# Patient Record
Sex: Male | Born: 1965 | Race: White | Hispanic: No | Marital: Married | Smoking: Former smoker
Health system: Southern US, Community
[De-identification: ages and names within clinical notes are randomized; demographics above are authoritative.]

---

## 1999-07-12 ENCOUNTER — Emergency Department (HOSPITAL_COMMUNITY): Admission: EM | Admit: 1999-07-12 | Discharge: 1999-07-12 | Payer: Self-pay | Admitting: Emergency Medicine

## 2004-04-21 ENCOUNTER — Emergency Department (HOSPITAL_COMMUNITY): Admission: EM | Admit: 2004-04-21 | Discharge: 2004-04-21 | Payer: Self-pay | Admitting: Emergency Medicine

## 2005-09-11 ENCOUNTER — Encounter (HOSPITAL_COMMUNITY): Admission: RE | Admit: 2005-09-11 | Discharge: 2005-10-11 | Payer: Self-pay | Admitting: Family Medicine

## 2005-09-14 ENCOUNTER — Inpatient Hospital Stay (HOSPITAL_COMMUNITY): Admission: EM | Admit: 2005-09-14 | Discharge: 2005-09-15 | Payer: Self-pay | Admitting: Emergency Medicine

## 2005-11-03 ENCOUNTER — Ambulatory Visit (HOSPITAL_COMMUNITY): Admission: RE | Admit: 2005-11-03 | Discharge: 2005-11-03 | Payer: Self-pay | Admitting: Emergency Medicine

## 2005-11-03 ENCOUNTER — Emergency Department (HOSPITAL_COMMUNITY): Admission: EM | Admit: 2005-11-03 | Discharge: 2005-11-03 | Payer: Self-pay | Admitting: Emergency Medicine

## 2005-12-19 ENCOUNTER — Encounter (HOSPITAL_COMMUNITY): Admission: RE | Admit: 2005-12-19 | Discharge: 2006-01-18 | Payer: Self-pay | Admitting: Specialist

## 2006-09-11 ENCOUNTER — Encounter (HOSPITAL_COMMUNITY): Admission: RE | Admit: 2006-09-11 | Discharge: 2006-10-11 | Payer: Self-pay | Admitting: Family Medicine

## 2006-09-17 ENCOUNTER — Encounter: Admission: RE | Admit: 2006-09-17 | Discharge: 2006-09-17 | Payer: Self-pay | Admitting: Family Medicine

## 2006-10-14 ENCOUNTER — Encounter (HOSPITAL_COMMUNITY): Admission: RE | Admit: 2006-10-14 | Discharge: 2006-11-13 | Payer: Self-pay | Admitting: Family Medicine

## 2007-10-18 ENCOUNTER — Emergency Department (HOSPITAL_COMMUNITY): Admission: EM | Admit: 2007-10-18 | Discharge: 2007-10-18 | Payer: Self-pay | Admitting: Emergency Medicine

## 2007-10-23 ENCOUNTER — Ambulatory Visit (HOSPITAL_COMMUNITY): Admission: RE | Admit: 2007-10-23 | Discharge: 2007-10-23 | Payer: Self-pay | Admitting: Family Medicine

## 2008-01-21 ENCOUNTER — Encounter (INDEPENDENT_AMBULATORY_CARE_PROVIDER_SITE_OTHER): Payer: Self-pay | Admitting: Surgery

## 2008-01-21 ENCOUNTER — Ambulatory Visit (HOSPITAL_COMMUNITY): Admission: RE | Admit: 2008-01-21 | Discharge: 2008-01-21 | Payer: Self-pay | Admitting: Surgery

## 2008-10-16 HISTORY — PX: CHOLECYSTECTOMY: SHX55

## 2009-07-27 ENCOUNTER — Emergency Department (HOSPITAL_COMMUNITY): Admission: EM | Admit: 2009-07-27 | Discharge: 2009-07-27 | Payer: Self-pay | Admitting: Emergency Medicine

## 2010-03-26 ENCOUNTER — Encounter: Payer: Self-pay | Admitting: Family Medicine

## 2010-07-17 NOTE — Op Note (Signed)
NAME:  Eric Mcdonald, Eric Mcdonald NO.:  [id number]   MEDICAL RECORD NO.:  [id number]          PATIENT TYPE:  AMB   LOCATION:  DAY                          FACILITY:  [facility]   PHYSICIAN:  Sandria Bales. Ezzard Standing, M.D.  DATE OF BIRTH:  Oct 29, 1965   DATE OF PROCEDURE:  01/21/2008  DATE OF DISCHARGE:                               OPERATIVE REPORT   Date of surgery ?   PREOPERATIVE DIAGNOSIS:  Chronic cholecystitis with cholelithiasis.   POSTOPERATIVE DIAGNOSIS:  Chronic cholecystitis with cholelithiasis.  (He has a gallstone size is 2.5 x 1.5 cm.).   SURGEON:  Sandria Bales. Ezzard Standing, M.D.   FIRST ASSISTANT:  None.   ANESTHESIA:  General endotracheal.   ESTIMATED BLOOD LOSS:  Minimal.   INDICATIONS FOR PROCEDURE:  Mr. Colee is a 45 year old white male who  has a patient of Maureen Golden/Dr. Fuller Mandril at [facility] who has had some bloating and abdominal pain and ultrasound  performed August 21 showed a large gallstone.   The patient now comes for attempted laparoscopic cholecystectomy.   The indication, potential complications of gallbladder surgery were  explained to the patient, potential complications include, but not  limited to, bleeding, infection, common bile duct injury, and  possibility of open surgery.   OPERATIVE NOTE:  The patient placed in supine position, given a general  endotracheal anesthetic.  He was given 1 gram of Ancef at initiation of  the procedure.  His abdomen was prepped with Techni-Care and sterilely  draped.  An infraumbilical incision was made.  Sharp dissection was  carried down to the abdominal cavity.  A 10 mm 0 degree laparoscope was  inserted through a 12 mm Hasson trocar.  Trocar secured with 0 Vicryl  suture.  I placed a 11-mm trocar in the subxiphoid location, a 5 mm in  the right mid subcostal and 5 mm right lateral subcostal locations.  The  gallbladder itself had some adhesions on the anterior abdominal wall  which I  took down using cautery and sharp dissection.  I got to the  gallbladder cystic duct junction and dissected out the cystic duct.  I  put clips on the cystic artery and a couple on the gallbladder side of  the cystic duct and shot intraoperative cholangiogram.   The intraoperative cholangiogram was shot using cutoff taut catheter  inserted through a 14 gauge Jelco.  I placed the taut catheter into the  side of the cut cystic duct.  I used about 8 mL of half strength Hypaque  solution and fluoroscopy showed free flow of contrast down the cystic  duct and into the common bile duct into the duodenum and up the hepatic  radicals.  This was felt to be a normal intraoperative cholangiogram.   I then removed the cystic duct catheter.  I placed three clips across  the cystic duct and divided the cystic duct and put clips across the  cystic artery and divided that.  I used primarily the hook Bovie to  divide the gallbladder from the gallbladder bed.  Prior to complete  division of the  gallbladder from the gallbladder bed, I evaluated the  gallbladder bed.  There was no bleeding and no bile leak.   I then took the gallbladder, placed it in an EndoCatch bag and delivered  it through the umbilicus.  Of note he had at least one very large stone  1.5 x 2.5 cm and I have given the specimen to the patient's family.  I  then irrigated the abdomen with a liter of saline.  I closed umbilical  port with a 0 Vicryl suture.  I closed the skin with a 5-0 Vicryl  suture, painted the wound with tincture of benzoin and steri-stripped  it.   The patient tolerated the procedure well, was transported to recovery  room in good condition.  Sponge and needle count were correct at the end  of the case.      Sandria Bales. Ezzard Standing, M.D.  Electronically Signed     DHN/MEDQ  D:  01/21/2008  T:  01/22/2008  Job:  [id number]   cc:   Marjory Lies, M.D.  Fax: [phone]

## 2010-07-20 NOTE — Consult Note (Signed)
NAME:  Eric Mcdonald, Eric Mcdonald NO.:  [id number]   MEDICAL RECORD NO.:  [id number]          PATIENT TYPE:  INP   LOCATION:  5504                         FACILITY:  [facility]   PHYSICIAN:  Georges Lynch. Gioffre, M.D.DATE OF BIRTH:  21-Apr-1965   DATE OF CONSULTATION:  09/14/2005  DATE OF DISCHARGE:                                   CONSULTATION   I was called to the emergency room this afternoon on September 14, 2005, to  evaluate this patient because of severe pain in his lower back and pain into  his right buttock.  He is a patient of Dr. Edythe Clarity.   Apparently he went to the office to see my partner, Dr. Shelle Iron, on Friday.  He had an unpaid bill, and he said he paid his bill then and he was given  some medications for pain and given an appointment to see Dr. Shelle Iron on  Monday.  He states that while at home yesterday and today he had difficulty  getting about.  In fact, he had to kneel to brush his teeth.  He was  extremely uncomfortable and that is why he came to the emergency room by  ambulance.   Basically, he has not lost any bowel or bladder control.  He just has severe  pain and spasms in his back.   ALLERGIES:  None.   MEDICATIONS:  None except the pain medication and muscle relaxants given to  him by Dr. Shelle Iron in the office.   HEALTH:  He has been in good health.   SURGERY:  Denied.   PHYSICAL EXAMINATION:  VITAL SIGNS:  In the emergency room, his blood  pressure was 111/69, temperature 98, pulse 51, respirations 20.  GENERAL:  He is alert and oriented as far as his mental status.  NECK:  No problems with his neck.  CHEST:  Clavicles negative.  Shoulders and upper extremities negative.  ABDOMEN:  Negative.  LUNGS:  Clear.  HEART:  Normal sinus rhythm with no murmur.  HIPS:  I could take his hips through range of motion without any difficulty.  He was lying supine with 2 pillows up under his knees because of his  discomfort in his back and right buttocks.  EXTREMITIES:  His sensory exam of his lower extremities was intact.  His  motor was exam was intact as well as I could examine, but he was a little  uncomfortable in his lower back.  Circulation was intact.  His calves were  fine.  No phlebitis.   X-rays of his back will be ordered when he is more comfortable.  He actually  is in spasms in his back and he really cannot get on and off an x-ray table.   PLAN:  I will bring him to the hospital to rule out a herniated disk.  We  will get an MRI of his back while he is in the hospital and also plain x-  rays of his back as well.           ______________________________  Georges Lynch. Darrelyn Hillock, M.D.     RAG/MEDQ  D:  09/14/2005  T:  09/15/2005  Job:  [id number]   cc:   Teena Irani. Arlyce Dice, M.D.  Fax: [phone]   Jene Every, M.D.  Fax: [phone]

## 2010-11-10 ENCOUNTER — Inpatient Hospital Stay (INDEPENDENT_AMBULATORY_CARE_PROVIDER_SITE_OTHER)
Admission: RE | Admit: 2010-11-10 | Discharge: 2010-11-10 | Disposition: A | Discharge status: Home / Self Care | Payer: Self-pay | Source: Ambulatory Visit | Attending: Emergency Medicine | Admitting: Emergency Medicine

## 2010-12-04 LAB — DIFFERENTIAL
Eosinophils Absolute: 0.2
Lymphocytes Relative: 24
Monocytes Absolute: 0.4
Monocytes Relative: 7
Neutrophils Relative %: 66

## 2010-12-04 LAB — COMPREHENSIVE METABOLIC PANEL
AST: 20
Albumin: 4.1
Alkaline Phosphatase: 54
BUN: 15
Calcium: 9.3
GFR calc Af Amer: >60
Glucose, Bld: 94
Sodium: 141

## 2010-12-04 LAB — CBC
HCT: 44.5
Hemoglobin: 15.2
MCHC: 34.3
RBC: 4.92
WBC: 6.4

## 2013-06-11 ENCOUNTER — Encounter: Payer: Self-pay | Admitting: Family Medicine

## 2013-06-28 ENCOUNTER — Encounter: Payer: BC Managed Care – PPO | Admitting: Physician Assistant

## 2013-07-16 ENCOUNTER — Encounter: Payer: Self-pay | Admitting: Family Medicine

## 2013-07-16 ENCOUNTER — Ambulatory Visit (INDEPENDENT_AMBULATORY_CARE_PROVIDER_SITE_OTHER): Payer: BC Managed Care – PPO | Admitting: Family Medicine

## 2013-07-16 VITALS — BP 118/84 | HR 64 | Temp 97.8°F | Resp 18 | Ht 71.0 in | Wt 203.0 lb

## 2013-07-16 DIAGNOSIS — R7989 Other specified abnormal findings of blood chemistry: Secondary | ICD-10-CM | POA: Insufficient documentation

## 2013-07-16 DIAGNOSIS — R5381 Other malaise: Secondary | ICD-10-CM

## 2013-07-16 DIAGNOSIS — Z0000 Encounter for general adult medical examination without abnormal findings: Secondary | ICD-10-CM | POA: Insufficient documentation

## 2013-07-16 DIAGNOSIS — Z125 Encounter for screening for malignant neoplasm of prostate: Secondary | ICD-10-CM

## 2013-07-16 DIAGNOSIS — R5383 Other fatigue: Secondary | ICD-10-CM

## 2013-07-16 DIAGNOSIS — E291 Testicular hypofunction: Secondary | ICD-10-CM

## 2013-07-16 DIAGNOSIS — N529 Male erectile dysfunction, unspecified: Secondary | ICD-10-CM

## 2013-07-16 LAB — COMPREHENSIVE METABOLIC PANEL
ALBUMIN: 4.8 g/dL (ref 3.5–5.2)
ALK PHOS: 76 U/L (ref 39–117)
ALT: 25 U/L (ref 0–53)
AST: 21 U/L (ref 0–37)
BILIRUBIN TOTAL: 0.6 mg/dL (ref 0.2–1.2)
BUN: 19 mg/dL (ref 6–23)
CALCIUM: 10.2 mg/dL (ref 8.4–10.5)
CO2: 27 meq/L (ref 19–32)
CREATININE: 1.19 mg/dL (ref 0.50–1.35)
Chloride: 100 mEq/L (ref 96–112)
GLUCOSE: 84 mg/dL (ref 70–99)
POTASSIUM: 5 meq/L (ref 3.5–5.3)
Sodium: 137 mEq/L (ref 135–145)
Total Protein: 7.3 g/dL (ref 6.0–8.3)

## 2013-07-16 LAB — CBC WITH DIFFERENTIAL/PLATELET
BASOS ABS: 0 10*3/uL (ref 0.0–0.1)
Basophils Relative: 0 % (ref 0–1)
Eosinophils Absolute: 0.1 10*3/uL (ref 0.0–0.7)
Eosinophils Relative: 1 % (ref 0–5)
HEMATOCRIT: 50.3 % (ref 39.0–52.0)
Hemoglobin: 17.9 g/dL — ABNORMAL HIGH (ref 13.0–17.0)
LYMPHS ABS: 1.8 10*3/uL (ref 0.7–4.0)
LYMPHS PCT: 23 % (ref 12–46)
MCH: 30.5 pg (ref 26.0–34.0)
MCHC: 35.6 g/dL (ref 30.0–36.0)
MCV: 85.8 fL (ref 78.0–100.0)
MONO ABS: 0.6 10*3/uL (ref 0.1–1.0)
Monocytes Relative: 8 % (ref 3–12)
NEUTROS ABS: 5.2 10*3/uL (ref 1.7–7.7)
NEUTROS PCT: 68 % (ref 43–77)
Platelets: 249 10*3/uL (ref 150–400)
RBC: 5.86 MIL/uL — ABNORMAL HIGH (ref 4.22–5.81)
RDW: 13.2 % (ref 11.5–15.5)
WBC: 7.7 10*3/uL (ref 4.0–10.5)

## 2013-07-16 LAB — TSH: TSH: 2.145 u[IU]/mL (ref 0.350–4.500)

## 2013-07-16 LAB — LIPID PANEL
CHOLESTEROL: 119 mg/dL (ref 0–200)
HDL: 34 mg/dL — ABNORMAL LOW (ref >39)
LDL CALC: 56 mg/dL (ref 0–99)
TRIGLYCERIDES: 145 mg/dL (ref <150)
Total CHOL/HDL Ratio: 3.5 Ratio
VLDL: 29 mg/dL (ref 0–40)

## 2013-07-16 LAB — TESTOSTERONE: Testosterone: 130 ng/dL — ABNORMAL LOW (ref 300–890)

## 2013-07-16 MED ORDER — SILDENAFIL CITRATE 100 MG PO TABS: 100.0000 mg | ORAL_TABLET | Freq: Every day | ORAL | Status: DC | PRN

## 2013-07-16 MED ORDER — TESTOSTERONE 30 MG/ACT TD SOLN: TRANSDERMAL | Status: DC

## 2013-07-16 NOTE — Patient Instructions (Signed)
Continue current medications Axiron changed to 60mg  F/IU in 6 months for medications

## 2013-07-16 NOTE — Progress Notes (Signed)
Patient ID: Eric Mcdonald, male   DOB: [birth year], 48 y.o.   MRN: [id number]   Subjective:    Patient ID: Eric Dorik S Scifres, male    DOB: [birth year], 48 y.o.   MRN: [id number]  Patient presents for new pt CPE  Patient here to establish care. He has not had a PCP in a few years. He was seen by an urgent care recently secondary to fatigue and erectile dysfunction he was found to have low testosterone was started on testosterone replacement. He states that he has had a prostate exam within the past year and declines one today. He continues to have problems with his erections. 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 He was prescribed testosterone gel which she start with one pump and then increase to 2 which works best for him. He was also given Viagra in the past however neither one of these surgical he covered on his insurance plan therefore he has not been able to take the Viagra a regular basis though it did help.  There is no history of hypertension, hyperlipidemia coronary artery disease. His family history was also reviewed. There is no history of prostate cancer or colon cancer in the family.   Review Of Systems:  GEN- + fatigue, fever, weight loss,weakness, recent illness HEENT- denies eye drainage, change in vision, nasal discharge, CVS- denies chest pain, palpitations RESP- denies SOB, cough, wheeze ABD- denies N/V, change in stools, abd pain GU- denies dysuria, hematuria, dribbling, incontinence MSK- denies joint pain, muscle aches, injury Neuro- denies headache, dizziness, syncope, seizure activity       Objective:    BP 118/84  Pulse 64  Temp(Src) 97.8 F (36.6 C) (Oral)  Resp 18  Ht 5\' 11"  (1.803 m)  Wt 203 lb (92.08 kg)  BMI 28.33 kg/m2 GEN- NAD, alert and oriented x3 HEENT- PERRL, EOMI, non injected sclera, pink conjunctiva, MMM, oropharynx clear, TM clear bilat no effusion Neck- Supple,  CVS- RRR, no  murmur RESP-CTAB ABD-NABS,soft,NT,ND EXT- No edema Psych- normal affect and mood Pulses- Radial, DP- 2+        Assessment & Plan:      Problem List Items Addressed This Visit   Routine general medical examination at a health care facility - Primary     CPE done, declines TDAP, colonoscopy age 48 Fasting labs    Relevant Orders      CBC with Differential (Completed)      Comprehensive metabolic panel (Completed)      Lipid panel (Completed)   Low testosterone     Recheck Testosterone level and PSA Declines DRE Continue testosterone replacement at 60mg  daily which works well for him    Relevant Orders      PSA (Completed)      Testosterone (Completed)   Erectile dysfunction     Has used     Relevant Orders      Testosterone (Completed)    Other Visit Diagnoses   Prostate cancer screening        Relevant Orders       PSA (Completed)    Other malaise and fatigue        Relevant Orders       TSH (Completed)       Note: This dictation was prepared with Dragon dictation along with smaller phrase technology. Any transcriptional errors that result from this process are unintentional.

## 2013-07-16 NOTE — Assessment & Plan Note (Addendum)
Has used Viagra, but unable to afford, given samples of 50mg  x 4 tablets, coupon card

## 2013-07-16 NOTE — Assessment & Plan Note (Addendum)
Recheck Testosterone level and PSA Declines DRE Continue testosterone replacement at 60mg  daily which works well for him

## 2013-07-16 NOTE — Assessment & Plan Note (Signed)
CPE done, declines TDAP, colonoscopy age 48 Fasting labs

## 2013-07-17 LAB — PSA: PSA: 0.52 ng/mL (ref <=4.00)

## 2013-07-19 ENCOUNTER — Telehealth: Payer: Self-pay | Admitting: *Deleted

## 2013-07-19 MED ORDER — SILDENAFIL CITRATE 100 MG PO TABS: 100.0000 mg | ORAL_TABLET | Freq: Every day | ORAL | Status: DC | PRN

## 2013-07-19 NOTE — Telephone Encounter (Signed)
Message copied by Phillips OdorSIX, CHRISTINA H on Mon Jul 19, 2013  3:37 PM ------      Message from: Donne AnonPLUMMER, KIM M      Created: Mon Jul 19, 2013  3:12 PM                   ----- Message -----         From: Malvin JohnsSusan S Bullins         Sent: 07/16/2013   3:18 PM           To: Donne AnonKim M Plummer, LPN            Patient was here today, he says that dr Jeanice Limdurham gave him coupon for viagra, he says the coupon is only for quantity of 6 and we only wrote for 4. Please call him back at [phone] ------

## 2013-07-19 NOTE — Telephone Encounter (Signed)
Call placed to patient. LMTRC.  New prescription sent to pharmacy.

## 2013-07-20 ENCOUNTER — Telehealth: Payer: Self-pay | Admitting: *Deleted

## 2013-07-20 NOTE — Telephone Encounter (Signed)
Call placed to patient and patient made aware.  

## 2013-07-20 NOTE — Telephone Encounter (Signed)
Patient came by office and made aware of results of labs.   Also had questions about OTC testosterone boosters and MVI.   MD please advise.  

## 2013-07-20 NOTE — Telephone Encounter (Signed)
I do not recommend any of the OTC testosterone boosters, I have not found any to be of any effect   He can take regular MVI without any concerns

## 2013-07-20 NOTE — Telephone Encounter (Signed)
LMTRC

## 2013-07-20 NOTE — Telephone Encounter (Signed)
Patient came by office and made aware.

## 2013-12-21 ENCOUNTER — Emergency Department (HOSPITAL_COMMUNITY): Payer: Worker's Compensation

## 2013-12-21 ENCOUNTER — Emergency Department (HOSPITAL_COMMUNITY)
Admission: EM | Admit: 2013-12-21 | Discharge: 2013-12-21 | Disposition: A | Discharge status: Home / Self Care | Payer: Worker's Compensation | Attending: Emergency Medicine | Admitting: Emergency Medicine

## 2013-12-21 ENCOUNTER — Encounter (HOSPITAL_COMMUNITY): Payer: Self-pay | Admitting: Emergency Medicine

## 2013-12-21 DIAGNOSIS — S20211A Contusion of right front wall of thorax, initial encounter: Secondary | ICD-10-CM | POA: Diagnosis not present

## 2013-12-21 DIAGNOSIS — Z720 Tobacco use: Secondary | ICD-10-CM | POA: Insufficient documentation

## 2013-12-21 DIAGNOSIS — S29001A Unspecified injury of muscle and tendon of front wall of thorax, initial encounter: Secondary | ICD-10-CM | POA: Diagnosis present

## 2013-12-21 NOTE — ED Notes (Signed)
Pt given incentive spirometer.

## 2013-12-21 NOTE — ED Provider Notes (Signed)
Medical screening examination/treatment/procedure(s) were performed by non-physician practitioner and as supervising physician I was immediately available for consultation/collaboration.   EKG Interpretation None        Valjean Ruppel, DO 12/21/13 2320 
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
## 2013-12-21 NOTE — Discharge Instructions (Signed)
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
## 2013-12-27 ENCOUNTER — Telehealth: Payer: Self-pay | Admitting: *Deleted

## 2013-12-27 ENCOUNTER — Encounter (HOSPITAL_COMMUNITY): Payer: Self-pay | Admitting: Emergency Medicine

## 2013-12-27 ENCOUNTER — Emergency Department (HOSPITAL_COMMUNITY)
Admission: EM | Admit: 2013-12-27 | Discharge: 2013-12-27 | Discharge status: Against Medical Advice | Payer: Worker's Compensation | Attending: Emergency Medicine | Admitting: Emergency Medicine

## 2013-12-27 ENCOUNTER — Emergency Department (HOSPITAL_COMMUNITY): Payer: Worker's Compensation

## 2013-12-27 DIAGNOSIS — Z720 Tobacco use: Secondary | ICD-10-CM | POA: Insufficient documentation

## 2013-12-27 DIAGNOSIS — R1011 Right upper quadrant pain: Secondary | ICD-10-CM | POA: Diagnosis not present

## 2013-12-27 DIAGNOSIS — R195 Other fecal abnormalities: Secondary | ICD-10-CM | POA: Diagnosis not present

## 2013-12-27 DIAGNOSIS — S3981XD Other specified injuries of abdomen, subsequent encounter: Secondary | ICD-10-CM | POA: Insufficient documentation

## 2013-12-27 DIAGNOSIS — R0781 Pleurodynia: Secondary | ICD-10-CM | POA: Insufficient documentation

## 2013-12-27 DIAGNOSIS — Z9049 Acquired absence of other specified parts of digestive tract: Secondary | ICD-10-CM | POA: Insufficient documentation

## 2013-12-27 DIAGNOSIS — R079 Chest pain, unspecified: Secondary | ICD-10-CM

## 2013-12-27 DIAGNOSIS — G8911 Acute pain due to trauma: Secondary | ICD-10-CM | POA: Insufficient documentation

## 2013-12-27 DIAGNOSIS — T1490XA Injury, unspecified, initial encounter: Secondary | ICD-10-CM

## 2013-12-27 LAB — COMPREHENSIVE METABOLIC PANEL
ALT: 19 U/L (ref 0–53)
AST: 23 U/L (ref 0–37)
Albumin: 4.2 g/dL (ref 3.5–5.2)
Alkaline Phosphatase: 71 U/L (ref 39–117)
Anion gap: 12 (ref 5–15)
BILIRUBIN TOTAL: 0.5 mg/dL (ref 0.3–1.2)
BUN: 14 mg/dL (ref 6–23)
CALCIUM: 10.1 mg/dL (ref 8.4–10.5)
CO2: 26 meq/L (ref 19–32)
Chloride: 98 mEq/L (ref 96–112)
Creatinine, Ser: 1.1 mg/dL (ref 0.50–1.35)
GFR, EST NON AFRICAN AMERICAN: 78 mL/min — AB (ref >90)
GLUCOSE: 91 mg/dL (ref 70–99)
Potassium: 4.9 mEq/L (ref 3.7–5.3)
SODIUM: 136 meq/L — AB (ref 137–147)
Total Protein: 7.7 g/dL (ref 6.0–8.3)

## 2013-12-27 LAB — CBC WITH DIFFERENTIAL/PLATELET
Basophils Absolute: 0 10*3/uL (ref 0.0–0.1)
Basophils Relative: 0 % (ref 0–1)
EOS PCT: 2 % (ref 0–5)
Eosinophils Absolute: 0.2 10*3/uL (ref 0.0–0.7)
HCT: 51 % (ref 39.0–52.0)
Hemoglobin: 18.3 g/dL — ABNORMAL HIGH (ref 13.0–17.0)
LYMPHS ABS: 2.1 10*3/uL (ref 0.7–4.0)
LYMPHS PCT: 25 % (ref 12–46)
MCH: 31.4 pg (ref 26.0–34.0)
MCHC: 35.9 g/dL (ref 30.0–36.0)
MCV: 87.5 fL (ref 78.0–100.0)
Monocytes Absolute: 0.5 10*3/uL (ref 0.1–1.0)
Monocytes Relative: 6 % (ref 3–12)
Neutro Abs: 5.6 10*3/uL (ref 1.7–7.7)
Neutrophils Relative %: 67 % (ref 43–77)
Platelets: 210 10*3/uL (ref 150–400)
RBC: 5.83 MIL/uL — AB (ref 4.22–5.81)
RDW: 12.8 % (ref 11.5–15.5)
WBC: 8.3 10*3/uL (ref 4.0–10.5)

## 2013-12-27 LAB — URINALYSIS, ROUTINE W REFLEX MICROSCOPIC
BILIRUBIN URINE: NEGATIVE
Glucose, UA: NEGATIVE mg/dL
HGB URINE DIPSTICK: NEGATIVE
Ketones, ur: NEGATIVE mg/dL
Leukocytes, UA: NEGATIVE
Nitrite: NEGATIVE
PROTEIN: NEGATIVE mg/dL
Specific Gravity, Urine: 1.01 (ref 1.005–1.030)
Urobilinogen, UA: 0.2 mg/dL (ref 0.0–1.0)
pH: 6 (ref 5.0–8.0)

## 2013-12-27 LAB — LIPASE, BLOOD: Lipase: 43 U/L (ref 11–59)

## 2013-12-27 MED ORDER — OXYCODONE-ACETAMINOPHEN 5-325 MG PO TABS
1.0000 | ORAL_TABLET | Freq: Once | ORAL | Status: AC
  Administered 2013-12-27: 1 via ORAL
  Filled 2013-12-27: qty 1

## 2013-12-27 NOTE — ED Notes (Signed)
Pt witnessed leaving from patient care area with keys in his hand, seen leaving ED

## 2013-12-27 NOTE — Telephone Encounter (Signed)
Pt called stating needing refill on testosterone medications, states when he went to pick up few days ago they told him he was 1 week late picking up and had put back on shelve and could not refill with authorization from provider.  [facility] [address]

## 2013-12-27 NOTE — ED Notes (Signed)
Patient in xray.  Will be returned to room after radiology.
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
## 2013-12-27 NOTE — Telephone Encounter (Signed)
Call placed to pharmacy.   Was advised that patient reordered medication and did not pick up x10 days.   States that if prescription is not picked up >10 days, medication is put back on shelf and patient needs to request to have it filled again.   No action was necessary from office.   Call placed to patient. LMTRC.
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
## 2013-12-27 NOTE — ED Notes (Signed)
Pt seen walking out of the department by a pharmacy tech and the nurse at [facility]. This RN looked for the pt throughout the department and could not find him.

## 2013-12-27 NOTE — ED Notes (Signed)
Pt requests to leave and come back after his dentist appointment. Signed out AMA.

## 2013-12-27 NOTE — ED Provider Notes (Signed)
Medical screening examination/treatment/procedure(s) were performed by non-physician practitioner and as supervising physician I was immediately available for consultation/collaboration.   EKG Interpretation None       Eric Mcdonald Weiland, MD 12/27/13 [phone]
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
## 2013-12-28 ENCOUNTER — Encounter (HOSPITAL_COMMUNITY): Payer: Self-pay | Admitting: Emergency Medicine

## 2013-12-28 ENCOUNTER — Emergency Department (INDEPENDENT_AMBULATORY_CARE_PROVIDER_SITE_OTHER)
Admission: EM | Admit: 2013-12-28 | Discharge: 2013-12-28 | Disposition: A | Discharge status: Home / Self Care | Payer: Worker's Compensation | Source: Home / Self Care | Attending: Family Medicine | Admitting: Family Medicine

## 2013-12-28 DIAGNOSIS — S20211A Contusion of right front wall of thorax, initial encounter: Secondary | ICD-10-CM

## 2013-12-28 MED ORDER — TRAMADOL HCL 50 MG PO TABS: 50.0000 mg | ORAL_TABLET | Freq: Four times a day (QID) | ORAL | Status: DC | PRN
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
## 2013-12-28 NOTE — Discharge Instructions (Signed)
Use heat and medicine as needed, see your doctor if further problems. °

## 2013-12-29 NOTE — Telephone Encounter (Signed)
Patient returned call and made aware.    States that he had been by pharmacy and picked up medication.

## 2013-12-29 NOTE — Telephone Encounter (Signed)
Call placed to patient. LMTRC.  

## 2014-01-21 ENCOUNTER — Ambulatory Visit: Payer: BC Managed Care – PPO | Admitting: Family Medicine

## 2014-03-02 ENCOUNTER — Encounter: Payer: Self-pay | Admitting: Family Medicine

## 2014-03-02 ENCOUNTER — Other Ambulatory Visit: Payer: Self-pay | Admitting: Family Medicine

## 2014-03-02 ENCOUNTER — Ambulatory Visit (INDEPENDENT_AMBULATORY_CARE_PROVIDER_SITE_OTHER): Payer: BC Managed Care – PPO | Admitting: Family Medicine

## 2014-03-02 VITALS — BP 122/66 | HR 78 | Temp 97.6°F | Resp 16 | Ht 71.0 in | Wt 204.0 lb

## 2014-03-02 DIAGNOSIS — R7989 Other specified abnormal findings of blood chemistry: Secondary | ICD-10-CM

## 2014-03-02 DIAGNOSIS — E291 Testicular hypofunction: Secondary | ICD-10-CM

## 2014-03-02 DIAGNOSIS — N528 Other male erectile dysfunction: Secondary | ICD-10-CM

## 2014-03-02 DIAGNOSIS — D582 Other hemoglobinopathies: Secondary | ICD-10-CM

## 2014-03-02 MED ORDER — TESTOSTERONE 30 MG/ACT TD SOLN: TRANSDERMAL | Status: DC

## 2014-03-02 NOTE — Assessment & Plan Note (Signed)
Recheck while on meds
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
## 2014-03-02 NOTE — Assessment & Plan Note (Signed)
Given viagra 50mg  samples #4 from office

## 2014-03-02 NOTE — Assessment & Plan Note (Signed)
Mild elevation in his hemoglobin he is nearing the upper limits. This could be due to his smoking versus the testosterone treatment. He'll return in 4 weeks to have these labs repeated along with the testosterone. I advised him to quit smoking. We also discussed A he may need to decrease his dosing was to go ahead and try just using 30 mg once a day

## 2014-03-02 NOTE — Patient Instructions (Signed)
Return in 4 weeks for labs

## 2014-04-01 ENCOUNTER — Other Ambulatory Visit: Payer: BC Managed Care – PPO

## 2014-08-03 ENCOUNTER — Ambulatory Visit (INDEPENDENT_AMBULATORY_CARE_PROVIDER_SITE_OTHER): Payer: BC Managed Care – PPO | Admitting: Physician Assistant

## 2014-08-03 ENCOUNTER — Encounter: Payer: Self-pay | Admitting: Physician Assistant

## 2014-08-03 VITALS — BP 126/80 | HR 70 | Temp 98.2°F | Resp 19 | Wt 194.0 lb

## 2014-08-03 DIAGNOSIS — M545 Low back pain, unspecified: Secondary | ICD-10-CM

## 2014-08-03 MED ORDER — TRAMADOL HCL 50 MG PO TABS: ORAL_TABLET | ORAL | Status: DC

## 2014-08-03 MED ORDER — CYCLOBENZAPRINE HCL 10 MG PO TABS: 10.0000 mg | ORAL_TABLET | Freq: Three times a day (TID) | ORAL | Status: DC | PRN

## 2014-08-04 NOTE — Progress Notes (Signed)
Patient ID: Eric Mcdonald MRN: [id number], DOB: [birth year], 49 y.o. Date of Encounter: 08/04/2014, 10:51 AM    Chief Complaint:  Chief Complaint  Patient presents with  . Back pain    Saturday morning     HPI: 49 y.o. year old white male reports that he has had problems with his back 2 or 3 times in the past. In about 2005 he had to miss 2 or 3 months of work because of his back.  However he says that he quit all of the things that were aggravating it and he was doing really well.  Quit basketball, quit doing the yard work that he was doing.  Says that Saturday morning 07/30/14 he lifted a box of books. Says he was feeling okay and then all of a sudden he developed a spasm/"catch" in his back. Says that it was at the level of L4-L5 midline and radiating out slightly bilaterally. Has had no pain tingling or weakness down either leg. Says that he has had that type of symptoms in the past but has not been having it with this episode. 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 Says that he found an old bottle of a muscle relaxer that had been prescribed back in approx. 2005 but otherwise could not find any medications to use from his prior back pain episodes.  Therefore, came in for OV in so that he could get updated prescription.     Home Meds:   Outpatient Prescriptions Prior to Visit  Medication Sig Dispense Refill  . sildenafil (VIAGRA) 50 MG tablet Take 50 mg by mouth daily as needed for erectile dysfunction.    . Testosterone (AXIRON) 30 MG/ACT SOLN Apply 60mg  once a day 90 mL 5   No facility-administered medications prior to visit.    Allergies: No Known Allergies    Review of Systems: See HPI for pertinent ROS. All other ROS negative.    Physical Exam: Blood pressure 126/80, pulse 70, temperature 98.2 F (36.8 C), temperature source Oral, resp. rate 19, weight 194 lb (87.998 kg)., Body mass index is 27.07  kg/(m^2). General: WNWD WM.  Appears in no acute distress. Neck: Supple. No thyromegaly. No lymphadenopathy. Lungs: Clear bilaterally to auscultation without wheezes, rales, or rhonchi. Breathing is unlabored. Heart: Regular rhythm. No murmurs, rubs, or gallops. Msk:  Strength and tone normal for age. At about L4-L5 level--midline and radiating out just slightly on both sides--- as the area that he has been having his discomfort. Mild tenderness with palpation in that area. Extremities/Skin: Warm and dry. Neuro: Alert and oriented X 3. Moves all extremities spontaneously. Gait is normal. CNII-XII grossly in tact. Psych:  Responds to questions appropriately with a normal affect.     ASSESSMENT AND PLAN:  49 y.o. year old male with  1. Midline low back pain without sciatica I reviewed that he had an MRI lumbar spine 09/18/2006. At this time will prescribe muscle relaxer and some tramadol to use as needed for significant pain. Cautioned that the Flexeril may cause drowsiness and if so can only take at night prior to sleep. Do not take prior to driving or working if causes drowsiness. He also stated that he might want to consider to 1 physical therapy over the summer since he will be out of work. However he does not want me to order this at this time. He says that he  will see how things are going and may call me if he wants me to place this order. Also discussed using heat to the area and gently stretching. Follow-up if symptoms worsen or are not controlled with this treatment plan. - cyclobenzaprine (FLEXERIL) 10 MG tablet; Take 1 tablet (10 mg total) by mouth 3 (three) times daily as needed for muscle spasms.  Dispense: 60 tablet; Refill: 0 - traMADol (ULTRAM) 50 MG tablet; Take 1 -2 every 8 hours as needed for pain  Dispense: 60 tablet; Refill: 0   Signed, [address] [place], [place], [facility] 08/04/2014 10:51 AM

## 2014-08-17 ENCOUNTER — Telehealth: Payer: Self-pay | Admitting: Family Medicine

## 2014-08-17 NOTE — Telephone Encounter (Signed)
Patient called reluctant to let me know anything about what he needed to talk to you about   He said please give him a call [phone] (H)

## 2014-08-17 NOTE — Telephone Encounter (Signed)
Returned call to patient.   Reports that he has been in marriage counseling for the past few weeks. States that therapist thinks he may benefit from medication for anxiety and depression.   Advised to schedule OV with MD to discuss. Appointment scheduled.

## 2014-08-17 NOTE — Telephone Encounter (Signed)
Agree with above 

## 2014-08-19 ENCOUNTER — Ambulatory Visit (INDEPENDENT_AMBULATORY_CARE_PROVIDER_SITE_OTHER): Payer: BC Managed Care – PPO | Admitting: Family Medicine

## 2014-08-19 ENCOUNTER — Encounter: Payer: Self-pay | Admitting: Family Medicine

## 2014-08-19 VITALS — BP 110/68 | HR 70 | Temp 98.2°F | Resp 14 | Ht 71.0 in | Wt 184.0 lb

## 2014-08-19 DIAGNOSIS — Z635 Disruption of family by separation and divorce: Secondary | ICD-10-CM | POA: Diagnosis not present

## 2014-08-19 DIAGNOSIS — F418 Other specified anxiety disorders: Secondary | ICD-10-CM

## 2014-08-19 NOTE — Patient Instructions (Signed)
Referral to psychiatry F/U 2 months   

## 2014-08-21 ENCOUNTER — Encounter: Payer: Self-pay | Admitting: Family Medicine

## 2014-08-21 DIAGNOSIS — Z635 Disruption of family by separation and divorce: Secondary | ICD-10-CM | POA: Insufficient documentation

## 2014-08-21 NOTE — Assessment & Plan Note (Signed)
Major depression, severe grief due to separation from his wife, he needs intensive therapy to help him , possibility of meds as well.  I feel like he thinks he is not being heard and no one understands his side of the story Will refer to psychiatry  I will not start any medications today  No evidence of self harm or harm t anyone else

## 2014-08-21 NOTE — Progress Notes (Signed)
Patient ID: Eric Mcdonald, male   DOB: 04/15/1965, 49 y.o.   MRN: [id number]   Subjective:    Patient ID: Eric Mcdonald, male    DOB: 01/31/1966, 49 y.o.   MRN: [id number]  Patient presents for Anxiety/ Depression  Pt here due to marital conflict, he in wife has been seperated since Nov 2015, he actually left the home. He states she has bipolar tendencies some times he would have a loving wife at home other times they would just fight and argue. Finances play a role, as well as a best friend he called "Susie" who he described as a very promiscuous woman but his wife finds to be her "soul mate" in a platonic fashion. His wife agreed to go to marriage therapist but seems to have already made her mind up she is divorcing him. He thinks he has world view depression and needs medications and therapy.     Review Of Systems:  GEN- denies fatigue, fever, weight loss,weakness, recent illness HEENT- denies eye drainage, change in vision, nasal discharge, CVS- denies chest pain, palpitations RESP- denies SOB, cough, wheeze ABD- denies N/V, change in stools, abd pain GU- denies dysuria, hematuria, dribbling, incontinence MSK- denies joint pain, muscle aches, injury Neuro- denies headache, dizziness, syncope, seizure activity       Objective:    BP 110/68 mmHg  Pulse 70  Temp(Src) 98.2 F (36.8 C) (Oral)  Resp 14  Ht 5\' 11"  (1.803 m)  Wt 184 lb (83.462 kg)  BMI 25.67 kg/m2 GEN- NAD, alert and oriented x3 Psych- very frustrated, teaful at time, angry at times. No SI, no HI, well groomed, thought process in tact        Assessment & Plan:      Problem List Items Addressed This Visit    Marital conflict involving divorce   Depression with anxiety - Primary    Major depression, severe grief due to separation from his wife, he needs intensive therapy to help him , possibility of meds as well.  I feel like he thinks he is not being heard and no one understands his side of the  story Will refer to psychiatry  I will not start any medications today  No evidence of self harm or harm t anyone else We also discussed his faith and how this can also help with his current situation  Approx 30 minutes spent with pt on couseling for depression.         Note: This dictation was prepared with Dragon dictation along with smaller phrase technology. Any transcriptional errors that result from this process are unintentional.

## 2014-08-23 ENCOUNTER — Telehealth: Payer: Self-pay | Admitting: Family Medicine

## 2014-08-23 DIAGNOSIS — M545 Low back pain: Secondary | ICD-10-CM

## 2014-08-23 NOTE — Telephone Encounter (Signed)
Patient is calling to get some recommendations about back doctors  [phone]

## 2014-08-23 NOTE — Telephone Encounter (Signed)
Referral order placed.   Call placed to patient and patient made aware per VM.

## 2014-08-23 NOTE — Telephone Encounter (Signed)
Returned call to patient.   States that he was seen in office for midline back pain and was given tramadol and flexeril. States that he has been diagnosed with pinched nerve at L4-L5. Reports that he has been through PT before and requested new referral to PT.   Also states that MD was to look into therapist and recommend one for patient.   MD please advise.

## 2014-08-23 NOTE — Telephone Encounter (Signed)
Okay to place PT referral They are working on psych referral

## 2014-08-29 ENCOUNTER — Other Ambulatory Visit: Payer: Self-pay | Admitting: Dermatology

## 2014-09-14 ENCOUNTER — Ambulatory Visit: Payer: Self-pay

## 2014-10-13 ENCOUNTER — Other Ambulatory Visit: Payer: Self-pay | Admitting: Family Medicine

## 2014-10-13 NOTE — Telephone Encounter (Signed)
Patient called in states that he had a prescription at [facility] for sildenafil (VIAGRA) 100 MG tablet however it has expired and would like to know if we can call in another prescription. He says he doesn't always get the whole prescription since it cost so much.

## 2014-10-14 MED ORDER — SILDENAFIL CITRATE 100 MG PO TABS: 100.0000 mg | ORAL_TABLET | Freq: Every day | ORAL | Status: DC | PRN

## 2014-10-14 NOTE — Telephone Encounter (Signed)
Medication refilled per protocol.  Pt aware 

## 2014-10-14 NOTE — Telephone Encounter (Signed)
Patient called back and would like to know the status of this refill. He would really like it called in today.

## 2014-10-17 ENCOUNTER — Ambulatory Visit: Payer: BC Managed Care – PPO | Admitting: Family Medicine

## 2014-10-20 ENCOUNTER — Telehealth: Payer: Self-pay | Admitting: *Deleted

## 2014-10-20 NOTE — Telephone Encounter (Signed)
Received prescription assistance forms partially completed from patient.   No medication noted in chart that would qualify for prescription assistance.   Call placed to patient to inquire. LMTRC.

## 2014-10-24 MED ORDER — SILDENAFIL CITRATE 100 MG PO TABS: 100.0000 mg | ORAL_TABLET | Freq: Every day | ORAL | Status: DC | PRN

## 2014-10-24 NOTE — Telephone Encounter (Signed)
Call placed to patient. LMTRC.  

## 2014-10-24 NOTE — Telephone Encounter (Signed)
Patient came to office.   Reports that forms are for Viagra (sildenafil).   Forms completed and placed on MD desk for review.

## 2014-10-27 NOTE — Telephone Encounter (Signed)
Received completed and signed forms from MD.   Faxed to [employer].

## 2014-11-03 ENCOUNTER — Telehealth: Payer: Self-pay | Admitting: Family Medicine

## 2014-11-03 NOTE — Telephone Encounter (Signed)
Pt's Viagra from pt assistance has been delivered.  Have left him message to come pick up.

## 2014-11-09 NOTE — Telephone Encounter (Signed)
Pt has picked up and signed for

## 2014-11-20 ENCOUNTER — Emergency Department (HOSPITAL_COMMUNITY)
Admission: EM | Admit: 2014-11-20 | Discharge: 2014-11-21 | Disposition: A | Discharge status: Home / Self Care | Payer: BC Managed Care – PPO | Attending: Emergency Medicine | Admitting: Emergency Medicine

## 2014-11-20 ENCOUNTER — Encounter (HOSPITAL_COMMUNITY): Payer: Self-pay | Admitting: Emergency Medicine

## 2014-11-20 ENCOUNTER — Emergency Department (HOSPITAL_COMMUNITY): Payer: BC Managed Care – PPO

## 2014-11-20 DIAGNOSIS — R1011 Right upper quadrant pain: Secondary | ICD-10-CM | POA: Insufficient documentation

## 2014-11-20 DIAGNOSIS — Z720 Tobacco use: Secondary | ICD-10-CM | POA: Insufficient documentation

## 2014-11-20 DIAGNOSIS — R109 Unspecified abdominal pain: Secondary | ICD-10-CM

## 2014-11-20 DIAGNOSIS — R110 Nausea: Secondary | ICD-10-CM | POA: Insufficient documentation

## 2014-11-20 DIAGNOSIS — K5900 Constipation, unspecified: Secondary | ICD-10-CM | POA: Diagnosis not present

## 2014-11-20 DIAGNOSIS — Z9049 Acquired absence of other specified parts of digestive tract: Secondary | ICD-10-CM | POA: Diagnosis not present

## 2014-11-20 DIAGNOSIS — Z79899 Other long term (current) drug therapy: Secondary | ICD-10-CM | POA: Insufficient documentation

## 2014-11-20 LAB — CBC WITH DIFFERENTIAL/PLATELET
BASOS PCT: 0 %
Basophils Absolute: 0 10*3/uL (ref 0.0–0.1)
EOS ABS: 0.1 10*3/uL (ref 0.0–0.7)
EOS PCT: 1 %
HCT: 45.5 % (ref 39.0–52.0)
HEMOGLOBIN: 16.4 g/dL (ref 13.0–17.0)
Lymphocytes Relative: 18 %
Lymphs Abs: 2.1 10*3/uL (ref 0.7–4.0)
MCH: 31.4 pg (ref 26.0–34.0)
MCHC: 36 g/dL (ref 30.0–36.0)
MCV: 87 fL (ref 78.0–100.0)
Monocytes Absolute: 0.9 10*3/uL (ref 0.1–1.0)
Monocytes Relative: 8 %
NEUTROS PCT: 73 %
Neutro Abs: 8.4 10*3/uL — ABNORMAL HIGH (ref 1.7–7.7)
PLATELETS: 191 10*3/uL (ref 150–400)
RBC: 5.23 MIL/uL (ref 4.22–5.81)
RDW: 12.7 % (ref 11.5–15.5)
WBC: 11.5 10*3/uL — AB (ref 4.0–10.5)

## 2014-11-20 MED ORDER — FENTANYL CITRATE (PF) 100 MCG/2ML IJ SOLN: 75.0000 ug | Freq: Once | INTRAMUSCULAR | Status: DC

## 2014-11-20 MED ORDER — POLYETHYLENE GLYCOL 3350 17 G PO PACK: 17.0000 g | PACK | Freq: Every day | ORAL | Status: DC

## 2014-11-20 MED ORDER — HYDROCODONE-ACETAMINOPHEN 5-325 MG PO TABS
2.0000 | ORAL_TABLET | Freq: Once | ORAL | Status: AC
  Administered 2014-11-20: 2 via ORAL
  Filled 2014-11-20: qty 2

## 2014-11-20 NOTE — ED Provider Notes (Signed)
CSN: [id number]     Arrival date & time 11/20/14  2103 History   First MD Initiated Contact with Patient 11/20/14 2155     Chief Complaint  Patient presents with  . Constipation     (Consider location/radiation/quality/duration/timing/severity/associated sxs/prior Treatment) HPI Comments: 49 year old male with history of low testosterone, smoker presents with right upper quadrant abdominal pain initially intermittent now constant worse with palpation. No vomiting. Last bowel movement yesterday patient has had more firm stools recently and this had poor dietary intake with constant cereal.  Patient tried laxity of without significant relief. Symptoms have been going on since Thursday. Patient gallbladder removed in the past no history of bowel obstruction. Patient does not recall passing gas today.  Patient is a 49 y.o. male presenting with constipation. The history is provided by the patient.  Constipation Associated symptoms: abdominal pain and nausea   Associated symptoms: no back pain, no dysuria, no fever and no vomiting     History reviewed. No pertinent past medical history. Past Surgical History  Procedure Laterality Date  . Cholecystectomy  10/16/2008    stones   Family History  Problem Relation Age of Onset  . COPD Father     Emphysema  . Diabetes Maternal Grandmother   . Cancer Maternal Grandfather     lung  . Cancer Paternal Grandfather     lung cancer   Social History  Substance Use Topics  . Smoking status: Current Some Day Smoker -- 0.50 packs/day    Types: Cigarettes, Pipe, Cigars  . Smokeless tobacco: Never Used  . Alcohol Use: No    Review of Systems  Constitutional: Negative for fever and chills.  HENT: Negative for congestion.   Eyes: Negative for visual disturbance.  Respiratory: Negative for shortness of breath.   Cardiovascular: Negative for chest pain.  Gastrointestinal: Positive for nausea, abdominal pain and constipation. Negative for vomiting.   Genitourinary: Negative for dysuria and flank pain.  Musculoskeletal: Negative for back pain, neck pain and neck stiffness.  Skin: Negative for rash.  Neurological: Negative for light-headedness and headaches.      Allergies  Peanuts  Home Medications   Prior to Admission medications   Medication Sig Start Date End Date Taking? Authorizing Provider  magnesium hydroxide (MILK OF MAGNESIA) 400 MG/5ML suspension Take 40 mLs by mouth daily as needed for mild constipation.   Yes Historical Provider, MD  ranitidine (ZANTAC) 150 MG tablet Take 150 mg by mouth 2 (two) times daily.   Yes Historical Provider, MD  sildenafil (VIAGRA) 100 MG tablet Take 1 tablet (100 mg total) by mouth daily as needed for erectile dysfunction. 10/24/14  Yes Salley Scarlet, MD  ibuprofen (ADVIL,MOTRIN) 200 MG tablet Take 200-600 mg by mouth every 6 (six) hours as needed for headache or moderate pain.    Historical Provider, MD  terbinafine (LAMISIL) 250 MG tablet Take 250 mg by mouth daily.    Historical Provider, MD   BP 109/71 mmHg  Pulse 60  Temp(Src) 98.3 F (36.8 C) (Oral)  Resp 18  Ht 6' (1.829 m)  Wt 202 lb 11.2 oz (91.944 kg)  BMI 27.49 kg/m2  SpO2 93% Physical Exam  Constitutional: He is oriented to person, place, and time. He appears well-developed and well-nourished.  HENT:  Head: Normocephalic and atraumatic.  Eyes: Right eye exhibits no discharge. Left eye exhibits no discharge.  Neck: Normal range of motion. Neck supple. No tracheal deviation present.  Cardiovascular: Normal rate.   Pulmonary/Chest: Effort normal.  Abdominal: Soft. He exhibits no distension. There is tenderness (focal right upper quadrant mild distention). There is no guarding.  Musculoskeletal: He exhibits no edema.  Neurological: He is alert and oriented to person, place, and time.  Skin: Skin is warm. No rash noted.  Psychiatric: He has a normal mood and affect.  Nursing note and vitals reviewed.   ED Course   Procedures (including critical care time) Labs Review Labs Reviewed  COMPREHENSIVE METABOLIC PANEL - Abnormal; Notable for the following:    Chloride 98 (*)    Glucose, Bld 107 (*)    All other components within normal limits  CBC WITH DIFFERENTIAL/PLATELET - Abnormal; Notable for the following:    WBC 11.5 (*)    Neutro Abs 8.4 (*)    All other components within normal limits  LIPASE, BLOOD    Imaging Review Dg Chest 2 View  11/23/2014   CLINICAL DATA:  Bilateral arm pain.  EXAM: CHEST  2 VIEW  COMPARISON:  Chest x-rays dated 11/20/2014 and 12/27/2013  FINDINGS: The heart size and mediastinal contours are within normal limits. Both lungs are clear. The visualized skeletal structures are unremarkable.  IMPRESSION: Normal exam.   Electronically Signed   By: Francene Boyers M.D.   On: 11/23/2014 21:33   I have personally reviewed and evaluated these images and lab results as part of my medical decision-making.   EKG Interpretation   Date/Time:  Sunday November 20 2014 22:16:50 EDT Ventricular Rate:  73 PR Interval:  156 QRS Duration: 96 QT Interval:  370 QTC Calculation: 408 R Axis:   14 Text Interpretation:  Sinus rhythm RSR' in V1 or V2, right VCD or RVH  Confirmed by Iretta Mangrum  MD, Jazaria Jarecki ([id number]) on 11/20/2014 11:09:47 PM      MDM   Final diagnoses:  Abdominal pain, unspecified abdominal location   Patient presents with worsening right upper quadrant abdominal pain and decreased bowel movements recently. With poor dietary intake focal tenderness and mild distention concern for constipation related. Plan for x-rays, basic blood work and reassessment. Stool softener.  Signed out to fup blood work and reassess.  Medications  HYDROcodone-acetaminophen (NORCO/VICODIN) 5-325 MG per tablet 2 tablet (2 tablets Oral Given 11/20/14 2344)  iohexol (OMNIPAQUE) 300 MG/ML solution 100 mL (100 mLs Intravenous Contrast Given 11/21/14 0105)  levofloxacin (LEVAQUIN) tablet 750 mg (750 mg  Oral Given 11/21/14 0224)    Filed Vitals:   11/21/14 0045 11/21/14 0200 11/21/14 0215 11/21/14 0225  BP: 119/75 112/71 109/71   Pulse: 66 61 60   Temp:    98.3 F (36.8 C)  TempSrc:    Oral  Resp:      Height:      Weight:      SpO2: 96% 93% 93%     Final diagnoses:  Abdominal pain, unspecified abdominal location       Blane Ohara, MD 11/24/14 1256

## 2014-11-20 NOTE — ED Notes (Signed)
Pt reports abdominal pain that started Friday. sts does not remember last BM. Took milk of mag. Denies nv.

## 2014-11-21 ENCOUNTER — Encounter (HOSPITAL_COMMUNITY): Payer: Self-pay

## 2014-11-21 ENCOUNTER — Encounter: Payer: Self-pay | Admitting: Physical Therapy

## 2014-11-21 ENCOUNTER — Emergency Department (HOSPITAL_COMMUNITY): Payer: BC Managed Care – PPO

## 2014-11-21 ENCOUNTER — Ambulatory Visit: Payer: BC Managed Care – PPO | Attending: Family Medicine | Admitting: Physical Therapy

## 2014-11-21 DIAGNOSIS — R531 Weakness: Secondary | ICD-10-CM | POA: Insufficient documentation

## 2014-11-21 DIAGNOSIS — M5441 Lumbago with sciatica, right side: Secondary | ICD-10-CM | POA: Diagnosis present

## 2014-11-21 LAB — COMPREHENSIVE METABOLIC PANEL
ALK PHOS: 63 U/L (ref 38–126)
ALT: 27 U/L (ref 17–63)
AST: 21 U/L (ref 15–41)
Albumin: 4.2 g/dL (ref 3.5–5.0)
Anion gap: 9 (ref 5–15)
BILIRUBIN TOTAL: 0.8 mg/dL (ref 0.3–1.2)
BUN: 11 mg/dL (ref 6–20)
CALCIUM: 9.1 mg/dL (ref 8.9–10.3)
CO2: 28 mmol/L (ref 22–32)
CREATININE: 1.18 mg/dL (ref 0.61–1.24)
Chloride: 98 mmol/L — ABNORMAL LOW (ref 101–111)
GFR calc non Af Amer: >60 mL/min (ref >60)
Glucose, Bld: 107 mg/dL — ABNORMAL HIGH (ref 65–99)
Potassium: 3.8 mmol/L (ref 3.5–5.1)
Sodium: 135 mmol/L (ref 135–145)
Total Protein: 7.3 g/dL (ref 6.5–8.1)

## 2014-11-21 LAB — LIPASE, BLOOD: LIPASE: 37 U/L (ref 22–51)

## 2014-11-21 MED ORDER — LEVOFLOXACIN 500 MG PO TABS: 500.0000 mg | ORAL_TABLET | Freq: Once | ORAL | Status: DC

## 2014-11-21 MED ORDER — DICYCLOMINE HCL 20 MG PO TABS: 20.0000 mg | ORAL_TABLET | Freq: Three times a day (TID) | ORAL | Status: DC | PRN

## 2014-11-21 MED ORDER — LEVOFLOXACIN 750 MG PO TABS
750.0000 mg | ORAL_TABLET | Freq: Once | ORAL | Status: AC
  Administered 2014-11-21: 750 mg via ORAL
  Filled 2014-11-21: qty 1

## 2014-11-21 MED ORDER — ONDANSETRON 8 MG PO TBDP: 8.0000 mg | ORAL_TABLET | Freq: Three times a day (TID) | ORAL | Status: DC | PRN

## 2014-11-21 MED ORDER — IOHEXOL 300 MG/ML  SOLN
100.0000 mL | Freq: Once | INTRAMUSCULAR | Status: AC | PRN
  Administered 2014-11-21: 100 mL via INTRAVENOUS

## 2014-11-21 MED ORDER — MORPHINE SULFATE (PF) 4 MG/ML IV SOLN
4.0000 mg | INTRAVENOUS | Status: DC | PRN
  Administered 2014-11-21: 4 mg via INTRAVENOUS
  Filled 2014-11-21: qty 1

## 2014-11-21 MED ORDER — ONDANSETRON HCL 4 MG/2ML IJ SOLN
4.0000 mg | Freq: Four times a day (QID) | INTRAMUSCULAR | Status: DC | PRN
  Administered 2014-11-21: 4 mg via INTRAVENOUS
  Filled 2014-11-21: qty 2

## 2014-11-21 MED ORDER — LEVOFLOXACIN 500 MG PO TABS: 500.0000 mg | ORAL_TABLET | Freq: Every day | ORAL | Status: DC

## 2014-11-21 NOTE — Discharge Instructions (Signed)

## 2014-11-21 NOTE — ED Notes (Signed)
Patient transported to CT 

## 2014-11-21 NOTE — ED Provider Notes (Signed)
1:57 AM Patient still with right upper quadrant pain on my examination.  He seems uncomfortable and tender.  CT scan was obtained to evaluate further.  There is question of right middle lobe infiltrate on his x-ray.  He has had cough.  He'll be covered for Levaquin as this could represent pleurisy with associated pneumonia.  CT scan is without acute intra-abdominal pathology.  I did speak with the patient at length regarding his pulmonary nodule which is noted.  He does smoke cigarettes.  He's been encouraged to stop smoking cigarettes.  He understands that he will need a repeat CT scan in 6 months to evaluate for stability of his pulmonary nodule.  Home with Bentyl for pain as well as some antinausea medicine if he becomes nauseated.  He will follow up closely with his primary care physician.  He will complete a seven-day course of Levaquin.  He understands to return to the ER for new or worsening symptoms  Ct Abdomen Pelvis W Contrast  11/21/2014   CLINICAL DATA:  Low back pain. Abdominal pain starting Friday. Right-sided abdominal pain.  EXAM: CT ABDOMEN AND PELVIS WITH CONTRAST  TECHNIQUE: Multidetector CT imaging of the abdomen and pelvis was performed using the standard protocol following bolus administration of intravenous contrast.  CONTRAST:  OMNIPAQUE IOHEXOL 300 MG/ML  SOLN  COMPARISON:  None.  FINDINGS: Emphysematous changes and slight scarring in the lung bases. Noncalcified nodule in the right lung base measuring 5.5 mm. If the patient is at high risk for bronchogenic carcinoma, follow-up chest CT at 6-12 months is recommended. If the patient is at low risk for bronchogenic carcinoma, follow-up chest CT at 12 months is recommended. This recommendation follows the consensus statement: Guidelines for Management of Small Pulmonary Nodules Detected on CT Scans: A Statement from the Fleischner Society as published in Radiology 2005;237:395-400.  Surgical absence of the gallbladder. No bile duct  dilatation. Mild diffuse fatty infiltration of the liver. No focal liver lesions. The pancreas, spleen, adrenal glands, kidneys, abdominal aorta, inferior vena cava, and retroperitoneal lymph nodes are unremarkable. Stomach, small bowel, and colon are not abnormally distended. Liquid stool throughout the colon may be associated with diarrhea. No colonic wall thickening is appreciated. No free air or free fluid in the abdomen.  Pelvis: The appendix is normal. Prostate gland is not significantly enlarged. Prostate calcifications. Scattered diverticula in the sigmoid colon. No evidence of diverticulitis. Bladder wall is not thickened. No pelvic mass or lymphadenopathy. No free or loculated pelvic fluid collections. Degenerative changes in the spine and hips. No destructive bone lesions.  IMPRESSION: Mild diffuse fatty infiltration of the liver. 5.5 mm nodule in the right lung base. See above followup recommendations. Liquid stool in the colon. No acute process otherwise identified.   Electronically Signed   By: Burman Nieves M.D.   On: 11/21/2014 01:42   Dg Abd Acute W/chest  11/20/2014   CLINICAL DATA:  Abdominal pain, severe pain mostly RIGHT upper quadrant, constipation, smoker  EXAM: DG ABDOMEN ACUTE W/ 1V CHEST  COMPARISON:  Chest radiograph 12/27/2013, abdominal radiographs 10/18/2007  FINDINGS: Normal heart size, mediastinal contours, and pulmonary vascularity.  Suspected RIGHT middle lobe infiltrate.  Remaining lungs clear.  Central peribronchial thickening.  No pleural effusion or pneumothorax.  Surgical clips RIGHT upper quadrant likely cholecystectomy.  Normal bowel gas pattern.  No bowel dilatation, bowel wall thickening, or free intraperitoneal air.  Scattered pelvic phleboliths.  No acute osseous findings.  IMPRESSION: No acute abdominal findings.  Bronchitic  changes with suspected RIGHT middle lobe infiltrate suspicious for pneumonia.   Electronically Signed   By: Ulyses Southward M.D.   On: 11/20/2014  23:54   I personally reviewed the imaging tests through PACS system I reviewed available ER/hospitalization records through the EMR   EKG Interpretation  Date/Time:  Sunday November 20 2014 22:16:50 EDT Ventricular Rate:  73 PR Interval:  156 QRS Duration: 96 QT Interval:  370 QTC Calculation: 408 R Axis:   14 Text Interpretation:  Sinus rhythm RSR' in V1 or V2, right VCD or RVH Confirmed by ZAVITZ  MD, JOSHUA ([id number]) on 11/20/2014 11:09:47 PM        Azalia Bilis, MD 11/21/14 1610

## 2014-11-21 NOTE — Therapy (Signed)
[facility] [address] [place], [place], [address] Phone: [phone]   Fax:  [phone]  Physical Therapy Evaluation  Patient Details  Name: Eric Mcdonald MRN: [id number] Date of Birth: [birth year] Referring Provider:  Salley Scarlet, MD  Encounter Date: 11/21/2014      PT End of Session - 11/21/14 1641    Visit Number 1   Date for PT Re-Evaluation 01/16/15   PT Start Time 1600   PT Stop Time 1700   PT Time Calculation (min) 60 min   Activity Tolerance Patient limited by pain   Behavior During Therapy [facility] for tasks assessed/performed      History reviewed. No pertinent past medical history.  Past Surgical History  Procedure Laterality Date  . Cholecystectomy  10/16/2008    stones    There were no vitals filed for this visit.  Visit Diagnosis:  Right-sided low back pain with right-sided sciatica - Plan: PT plan of care cert/re-cert  Weakness - Plan: PT plan of care cert/re-cert      Subjective Assessment - 11/21/14 1603    Subjective Patient reports last friday pain in back started with sudden onset.  Patient was in the ER last night due to right upper quadrant pain. Pain down right leg.  Difficulty using right leg.  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 Patient reports he is in alot of depression and does not want to take medication.    How long can you sit comfortably? sit 5 min.    How long can you stand comfortably? stand 10 min.    Patient Stated Goals improve strength in back and right leg, reduce pain.    Currently in Pain? Yes   Pain Score 6    Pain Location Back  right upper quadrant   Pain Orientation Right;Lower   Pain Descriptors / Indicators Throbbing   Pain Type Acute pain   Pain Onset In the past 7 days   Pain Frequency Constant   Aggravating Factors  sitting, standing   Pain Relieving Factors Keeping right leg strong   Multiple Pain Sites No             [facility] PT Assessment - 11/21/14 0001    Assessment   Medical Diagnosis M54.5 Midline low back pain, with sciatica presence unspecified   Onset Date/Surgical Date 11/18/14   Prior Therapy no   Precautions   Precautions None   Balance Screen   Has the patient fallen in the past 6 months No   Has the patient had a decrease in activity level because of a fear of falling?  No   Is the patient reluctant to leave their home because of a fear of falling?  No   Prior Function   Level of Independence Independent   Cognition   Overall Cognitive Status Within Functional Limits for tasks assessed   Observation/Other Assessments   Focus on Therapeutic Outcomes (FOTO)  84% limitation CM  goal 56% limitation goal CK   Posture/Postural Control   Posture/Postural Control Postural limitations   Postural Limitations Rounded Shoulders;Forward head;Decreased lumbar lordosis   ROM / Strength   AROM / PROM / Strength AROM;Strength;PROM   AROM   Lumbar Flexion decreased by 50%   Lumbar Extension decreased by 50%   Lumbar - Right Side Bend decreased by 25%   Lumbar - Left Side Bend full   Lumbar - Right Rotation decreased by 25%   Lumbar - Left  Rotation decreased by 25%   PROM   Overall PROM Comments right hip ER 40 degrees;    Strength   Overall Strength Comments right hip flexion 4/5 with pain, right knee flexion 4/5 no pain   Palpation   Spinal mobility L3-L5 rotated right   SI assessment  righ tilum anteriorly rotated   Palpation comment palpable tenderness located in right psoas, bil. lumbar paraspinals, right gluteal   Straight Leg Raise   Findings Negative   Side  Right   Comment no pain   Gaenslen's test   Findings Positive   Side  Right   Comments pain   Ambulation/Gait   Ambulation/Gait Yes   Gait Pattern Decreased stance time - right;Decreased weight shift to right   Gait Comments flexed posture                   [facility] Adult PT Treatment/Exercise - 11/21/14 0001     Modalities   Modalities Electrical Stimulation;Moist Heat   Moist Heat Therapy   Number Minutes Moist Heat 15 Minutes   Moist Heat Location Lumbar Spine  prone   Electrical Stimulation   Electrical Stimulation Location lumbar  prone   Electrical Stimulation Action IFC   Electrical Stimulation Parameters to patients tolerance, 15 min   Electrical Stimulation Goals Pain                PT Education - 11/21/14 1640    Education provided Yes   Education Details walking in the pool   Person(s) Educated Patient   Methods Explanation   Comprehension Verbalized understanding          PT Short Term Goals - 11/21/14 1649    PT SHORT TERM GOAL #1   Title understand correct body mechanics with daily tasks   Time 4   Period Weeks   Status New   PT SHORT TERM GOAL #2   Title independent with flexibility exercises   Time 4   Period Weeks   Status New   PT SHORT TERM GOAL #3   Title pain is moderate making it easier to move around in his bed   Time 4   Period Weeks   Status New           PT Long Term Goals - 11/21/14 1650    PT LONG TERM GOAL #1   Title indpendent with HEP and understand how to progress himself   Time 8   Period Weeks   Status New   PT LONG TERM GOAL #2   Title return to his exercise program to keep his trunk and right leg strong   Time 8   Period Weeks   Status New   PT LONG TERM GOAL #3   Title sit and stand with pain decreased to minimal level   Time 8   Period Weeks   Status New   PT LONG TERM GOAL #4   Title move in bed with minimal pain and bracing his abdominals   Time 8   Period Weeks   Status New   Additional Long Term Goals   Additional Long Term Goals Yes               Plan - 11/21/14 1641    Clinical Impression Statement Patient is a 49 year old male with diagnosis of midline low back pain with sciatica presence unspecified with sudden onset on 11/18/2014.  Patient was in the ER on 11/20/2014 for right upper quadrant  pain with possible pnuemonia .  Patient will be seeing his primary care doctor tomorrow to have the area cleared out.  Patient reports he has had chronic back pain for the past 20 years.  Patient reports his lumbar pain is at level  6/10 in lumbar and into right leg.  Patient reports all movements increase his pain especilly sitting standing trunk movement.  Patient has reported he is battling depression and has not been able to exercise for keeping his back strong.  FOTO score is 84% limitation.  Lumbar ROM deficits is as follows: extension decreased by 50% and bil. rotation and sidebending decreased by 25%.  Right ilium is rotated anteriorly and sacrum is rotated right inot extnesion.  Decreased mobility of L1-L5 with L3-L5 rotated right.  Right knee flexion 4/5 and right hip flexion 4/5 with pain.  Palpable tenderness located in right lower quadrant and below right rib cage, right psoas, right gluteal, and right lumbar paraspinals. Patient would benefit from physical therapy to improve lumbar and right hip mobility, decrease lumbar and right leg pain.     Pt will benefit from skilled therapeutic intervention in order to improve on the following deficits Abnormal gait;Decreased activity tolerance;Decreased mobility;Decreased strength;Impaired flexibility;Pain;Increased muscle spasms;Decreased endurance;Decreased range of motion   Rehab Potential Excellent   Clinical Impairments Affecting Rehab Potential None   PT Frequency 2x / week   PT Duration 8 weeks   PT Treatment/Interventions ADLs/Self Care Home Management;Cryotherapy;Electrical Stimulation;Moist Heat;Therapeutic exercise;Therapeutic activities;Ultrasound;Traction;Neuromuscular re-education;Patient/family education;Manual techniques;Taping;Passive range of motion   PT Next Visit Plan See what MD says about right upper quadrant, modalities, soft tissue work, joint mobilization to lumbar spine, correct pelvis, abdominal bracing, body mechanics   PT  Home Exercise Plan body mechanics   Recommended Other Services None   Consulted and Agree with Plan of Care Patient         Problem List Patient Active Problem List   Diagnosis Date Noted  . Marital conflict involving divorce 08/21/2014  . Depression with anxiety 08/19/2014  . Elevated hemoglobin 03/02/2014  . Routine general medical examination at a health care facility 07/16/2013  . Low testosterone 07/16/2013  . Erectile dysfunction 07/16/2013    GRAY,CHERYL,PT 11/21/2014, 4:54 PM  [facility] [address] [place], [place], [address] Phone: [phone]   Fax:  [phone]

## 2014-11-22 ENCOUNTER — Ambulatory Visit (INDEPENDENT_AMBULATORY_CARE_PROVIDER_SITE_OTHER): Payer: BC Managed Care – PPO | Admitting: Family Medicine

## 2014-11-22 ENCOUNTER — Encounter: Payer: Self-pay | Admitting: Family Medicine

## 2014-11-22 ENCOUNTER — Telehealth: Payer: Self-pay | Admitting: Family Medicine

## 2014-11-22 ENCOUNTER — Ambulatory Visit: Payer: BC Managed Care – PPO | Admitting: Physical Therapy

## 2014-11-22 ENCOUNTER — Encounter: Payer: Self-pay | Admitting: Physical Therapy

## 2014-11-22 VITALS — BP 108/78 | HR 76 | Temp 98.0°F | Resp 18 | Wt 201.0 lb

## 2014-11-22 DIAGNOSIS — M5441 Lumbago with sciatica, right side: Secondary | ICD-10-CM

## 2014-11-22 DIAGNOSIS — G8929 Other chronic pain: Secondary | ICD-10-CM

## 2014-11-22 DIAGNOSIS — R1010 Upper abdominal pain, unspecified: Secondary | ICD-10-CM

## 2014-11-22 DIAGNOSIS — R531 Weakness: Secondary | ICD-10-CM

## 2014-11-22 DIAGNOSIS — R911 Solitary pulmonary nodule: Secondary | ICD-10-CM

## 2014-11-22 DIAGNOSIS — R1011 Right upper quadrant pain: Principal | ICD-10-CM

## 2014-11-22 NOTE — Progress Notes (Signed)
Subjective:    Patient ID: Eric Mcdonald, male    DOB: 1965-05-18, 49 y.o.   MRN: [id number]  HPI I have reviewed the patient's emergency room records. Patient states that he has had severe right upper quadrant abdominal pain. It began this weekend and the patient went to the emergency room September 18. This CT scan revealed a coincidental finding of a 5 mm right lower lobe pulmonary nodule. There was no acute intra-abdominal pathology to explain the patient's symptoms. Patient states that whenever he sits up or twists or bends over, he developed severe pain in his abdominal wall. He denies any nausea or vomiting or diarrhea. He denies any melena or hematochezia. He denies any fevers or chills. Food does not exacerbate the pain. Having a bowel movement does not relieve the pain. The pain seems to be musculoskeletal in that whenever he tries to do a sit up or twist or rotates his torso, the pain is triggered. Whenever the patient sits still, the patient has no pain whatsoever. However there is no tenderness to palpation in left lower quadrant. There are no peritoneal signs.  Patient denies any pain in the right side of his abdomen was sudden deep palpation of the left side. Patient Active Problem List   Diagnosis Date Noted  . Marital conflict involving divorce 08/21/2014  . Depression with anxiety 08/19/2014  . Elevated hemoglobin 03/02/2014  . Routine general medical examination at a health care facility 07/16/2013  . Low testosterone 07/16/2013  . Erectile dysfunction 07/16/2013   Past Surgical History  Procedure Laterality Date  . Cholecystectomy  10/16/2008    stones   Current Outpatient Prescriptions on File Prior to Visit  Medication Sig Dispense Refill  . dicyclomine (BENTYL) 20 MG tablet Take 1 tablet (20 mg total) by mouth every 8 (eight) hours as needed (abdominal pain). 20 tablet 0  . ibuprofen (ADVIL,MOTRIN) 200 MG tablet Take 200 mg by mouth every 4 (four) hours as needed for  mild pain.    Marland Kitchen levofloxacin (LEVAQUIN) 500 MG tablet Take 1 tablet (500 mg total) by mouth daily. 6 tablet 0  . magnesium hydroxide (MILK OF MAGNESIA) 400 MG/5ML suspension Take 40 mLs by mouth daily as needed for mild constipation.    . ondansetron (ZOFRAN ODT) 8 MG disintegrating tablet Take 1 tablet (8 mg total) by mouth every 8 (eight) hours as needed for nausea or vomiting. 12 tablet 0  . ranitidine (ZANTAC) 150 MG tablet Take 150 mg by mouth 2 (two) times daily.    . sildenafil (VIAGRA) 100 MG tablet Take 1 tablet (100 mg total) by mouth daily as needed for erectile dysfunction. 10 tablet 11  . terbinafine (LAMISIL) 1 % cream Apply 1 application topically 2 (two) times daily.     No current facility-administered medications on file prior to visit.   No Known Allergies Social History   Social History  . Marital Status: Married    Spouse Name: N/A  . Number of Children: N/A  . Years of Education: N/A   Occupational History  . Not on file.   Social History Main Topics  . Smoking status: Current Some Day Smoker -- 0.50 packs/day    Types: Cigarettes, Pipe, Cigars  . Smokeless tobacco: Never Used  . Alcohol Use: No  . Drug Use: No  . Sexual Activity: Not Currently   Other Topics Concern  . Not on file   Social History Narrative     Review of Systems  All  other systems reviewed and are negative.      Objective:   Physical Exam  Constitutional: He appears well-developed and well-nourished. No distress.  Cardiovascular: Normal rate, regular rhythm and normal heart sounds.   No murmur heard. Pulmonary/Chest: Effort normal.  Abdominal: Soft. Bowel sounds are normal. He exhibits no distension and no mass. There is tenderness. There is guarding. There is no rebound.  Skin: He is not diaphoretic.  Vitals reviewed.         Assessment & Plan:  Abdominal pain, chronic, right upper quadrant  I see no evidence of peritonitis. I believe the patient has strained or torn  abdominal wall muscle such as his oblique. I anticipate gradual improvement over the next 2-3 weeks. His physical exam is consistent more with a muscle strain than any intra-abdominal process. Patient agrees. Another possibility would be a duodenal ulcer. I did give the patient samples of dexilant 60 mg poqday to see his symptoms would improve sooner. He is to call me back immediately if symptoms worsen or change in any way. I would recommend a repeat CT scan of his chest in 6 months to monitor the pulmonary nodule.  Recheck in one week if no better or sooner if worse

## 2014-11-22 NOTE — Telephone Encounter (Signed)
Patient seen by dr Tanya Nones today, went to try to go get crutches and could not without dr pickards approval would like a call back to [phone]

## 2014-11-22 NOTE — Telephone Encounter (Signed)
LMTRC

## 2014-11-22 NOTE — Therapy (Addendum)
[facility] [address], [place], [place], [address] Phone: [phone]   Fax:  [phone]  Physical Therapy Treatment  Patient Details  Name: Eric Mcdonald MRN: [id number] Date of Birth: 08/30/1965 Referring Provider:  Alycia Rossetti, MD  Encounter Date: 11/22/2014      PT End of Session - 11/22/14 1410    Visit Number 2   Date for PT Re-Evaluation 01/16/15   PT Start Time 1400   PT Stop Time 1500   PT Time Calculation (min) 60 min   Activity Tolerance Patient limited by pain   Behavior During Therapy [facility] for tasks assessed/performed      History reviewed. No pertinent past medical history.  Past Surgical History  Procedure Laterality Date  . Cholecystectomy  10/16/2008    stones    There were no vitals filed for this visit.  Visit Diagnosis:  Right-sided low back pain with right-sided sciatica  Weakness      Subjective Assessment - 11/22/14 1405    Subjective I saw the doctor about my rib cage pain.  MD feels it is muscular and will monitor it.  I am having pain in left foot and feel like I tore a muscle in it.  I am unable to walk on my left foot. The stim and heat has helped.    How long can you sit comfortably? sit 5 min.    How long can you stand comfortably? stand 10 min.    Patient Stated Goals improve strength in back and right leg, reduce pain.    Currently in Pain? Yes   Pain Score 5    Pain Location Back   Pain Orientation Right;Lower   Pain Descriptors / Indicators Throbbing   Pain Type Acute pain   Pain Onset In the past 7 days   Pain Frequency Constant   Aggravating Factors  sitting, standing   Pain Relieving Factors keeping right leg strong   Multiple Pain Sites Yes   Pain Score 9   Pain Location Foot   Pain Orientation Left   Pain Descriptors / Indicators Throbbing  hot   Pain Type Acute pain   Pain Onset Yesterday   Pain Frequency Constant   Aggravating Factors   putting pressure on left foot   Pain Relieving Factors off feet   Effect of Pain on Daily Activities trouble walking                         [facility] Adult PT Treatment/Exercise - 11/22/14 0001    Ambulation/Gait   Gait Comments educated patient on how to ambulate with a straight cane to take pressure off the left foot   Modalities   Modalities Traction   Traction   Type of Traction Lumbar   Min (lbs) 50   Max (lbs) 68   Time 17   Manual Therapy   Manual Therapy Soft tissue mobilization   Manual therapy comments bil. lumbar paraspinals, right piriformis, manually stretche bil. hip flexors and hip rotators in prone                PT Education - 11/22/14 1508    Education provided Yes   Education Details education on walking with a single point cane and where to purchase one   Person(s) Educated Patient   Methods Explanation;Verbal cues   Comprehension Verbalized understanding;Returned demonstration          PT Short Term Goals -  11/21/14 1649    PT SHORT TERM GOAL #1   Title understand correct body mechanics with daily tasks   Time 4   Period Weeks   Status New   PT SHORT TERM GOAL #2   Title independent with flexibility exercises   Time 4   Period Weeks   Status New   PT SHORT TERM GOAL #3   Title pain is moderate making it easier to move around in his bed   Time 4   Period Weeks   Status New           PT Long Term Goals - 11/21/14 1650    PT LONG TERM GOAL #1   Title indpendent with HEP and understand how to progress himself   Time 8   Period Weeks   Status New   PT LONG TERM GOAL #2   Title return to his exercise program to keep his trunk and right leg strong   Time 8   Period Weeks   Status New   PT LONG TERM GOAL #3   Title sit and stand with pain decreased to minimal level   Time 8   Period Weeks   Status New   PT LONG TERM GOAL #4   Title move in bed with minimal pain and bracing his abdominals   Time 8   Period  Weeks   Status New   Additional Long Term Goals   Additional Long Term Goals Yes               Plan - 11/22/14 1443    Clinical Impression Statement Patient is a 49 year old male with diagnosis of midline low back pain with sciatica presence unspecified with sudden onset on 11/18/2014.  Patient saw MD on right upper quadrant pain and will be monitored due to MD feeling it is muscular.  Patient is now presenting pain in left foot with difficulty placing pressure on the left foot. Patient was not taught correct body mechnanics due to left foot pain.  Patient feels the soft tissue work has helped.  Patient has difficulty with  getting into differnet positions.  Patient will benefit from physical therapy to improvelumbar pain and right hip mobiity to decrease lumbar and right leg pain.    Pt will benefit from skilled therapeutic intervention in order to improve on the following deficits Abnormal gait;Decreased activity tolerance;Decreased mobility;Decreased strength;Impaired flexibility;Pain;Increased muscle spasms;Decreased endurance;Decreased range of motion   Rehab Potential Excellent   Clinical Impairments Affecting Rehab Potential None   PT Frequency 2x / week   PT Duration 8 weeks   PT Treatment/Interventions ADLs/Self Care Home Management;Cryotherapy;Electrical Stimulation;Moist Heat;Therapeutic exercise;Therapeutic activities;Ultrasound;Traction;Neuromuscular re-education;Patient/family education;Manual techniques;Taping;Passive range of motion   PT Next Visit Plan See what MD says about right upper quadrant, modalities, soft tissue work, joint mobilization to lumbar spine, correct pelvis, abdominal bracing, body mechanics   PT Home Exercise Plan body mechanics   Consulted and Agree with Plan of Care Patient        Problem List Patient Active Problem List   Diagnosis Date Noted  . Marital conflict involving divorce 08/21/2014  . Depression with anxiety 08/19/2014  . Elevated  hemoglobin 03/02/2014  . Routine general medical examination at a health care facility 07/16/2013  . Low testosterone 07/16/2013  . Erectile dysfunction 07/16/2013    GRAY,CHERYL,PT 11/22/2014, 3:09 PM  [facility] [address], [place], [place], [address] Phone: [phone]   Fax:  [phone]  PHYSICAL THERAPY DISCHARGE SUMMARY  Visits from Start of Care: 2  Current functional level related to goals / functional outcomes: See above. Unable to assess patient due to not returning after last visit.    Remaining deficits: See above.    Education / Equipment: HEP Plan:                                                    Patient goals were not met. Patient is being discharged due to not returning since the last visit.  Thank you for the referral. Earlie Counts, PT 01/25/2015 9:45 AM  ?????

## 2014-11-23 ENCOUNTER — Emergency Department (HOSPITAL_COMMUNITY): Payer: BC Managed Care – PPO

## 2014-11-23 ENCOUNTER — Encounter (HOSPITAL_COMMUNITY): Payer: Self-pay | Admitting: Emergency Medicine

## 2014-11-23 ENCOUNTER — Inpatient Hospital Stay (HOSPITAL_COMMUNITY)
Admission: EM | Admit: 2014-11-23 | Discharge: 2014-11-27 | DRG: 872 | Disposition: A | Discharge status: Home / Self Care | Payer: BC Managed Care – PPO | Attending: Internal Medicine | Admitting: Internal Medicine

## 2014-11-23 ENCOUNTER — Encounter: Payer: Self-pay | Admitting: Physician Assistant

## 2014-11-23 ENCOUNTER — Ambulatory Visit (INDEPENDENT_AMBULATORY_CARE_PROVIDER_SITE_OTHER): Payer: BC Managed Care – PPO | Admitting: Physician Assistant

## 2014-11-23 VITALS — BP 98/60 | HR 68 | Temp 98.1°F | Resp 18

## 2014-11-23 DIAGNOSIS — R509 Fever, unspecified: Secondary | ICD-10-CM | POA: Diagnosis present

## 2014-11-23 DIAGNOSIS — Z91010 Allergy to peanuts: Secondary | ICD-10-CM

## 2014-11-23 DIAGNOSIS — M2550 Pain in unspecified joint: Secondary | ICD-10-CM

## 2014-11-23 DIAGNOSIS — R739 Hyperglycemia, unspecified: Secondary | ICD-10-CM | POA: Diagnosis present

## 2014-11-23 DIAGNOSIS — M79602 Pain in left arm: Secondary | ICD-10-CM

## 2014-11-23 DIAGNOSIS — Z801 Family history of malignant neoplasm of trachea, bronchus and lung: Secondary | ICD-10-CM

## 2014-11-23 DIAGNOSIS — Z791 Long term (current) use of non-steroidal anti-inflammatories (NSAID): Secondary | ICD-10-CM

## 2014-11-23 DIAGNOSIS — M79601 Pain in right arm: Secondary | ICD-10-CM

## 2014-11-23 DIAGNOSIS — Z825 Family history of asthma and other chronic lower respiratory diseases: Secondary | ICD-10-CM

## 2014-11-23 DIAGNOSIS — Z79899 Other long term (current) drug therapy: Secondary | ICD-10-CM

## 2014-11-23 DIAGNOSIS — M79672 Pain in left foot: Secondary | ICD-10-CM

## 2014-11-23 DIAGNOSIS — M25562 Pain in left knee: Secondary | ICD-10-CM

## 2014-11-23 DIAGNOSIS — F17210 Nicotine dependence, cigarettes, uncomplicated: Secondary | ICD-10-CM | POA: Diagnosis present

## 2014-11-23 DIAGNOSIS — M130 Polyarthritis, unspecified: Secondary | ICD-10-CM | POA: Diagnosis present

## 2014-11-23 DIAGNOSIS — R6510 Systemic inflammatory response syndrome (SIRS) of non-infectious origin without acute organ dysfunction: Secondary | ICD-10-CM | POA: Diagnosis not present

## 2014-11-23 DIAGNOSIS — Z833 Family history of diabetes mellitus: Secondary | ICD-10-CM

## 2014-11-23 DIAGNOSIS — K5900 Constipation, unspecified: Secondary | ICD-10-CM | POA: Diagnosis present

## 2014-11-23 DIAGNOSIS — M25539 Pain in unspecified wrist: Secondary | ICD-10-CM | POA: Diagnosis not present

## 2014-11-23 DIAGNOSIS — D72829 Elevated white blood cell count, unspecified: Secondary | ICD-10-CM | POA: Diagnosis present

## 2014-11-23 DIAGNOSIS — K219 Gastro-esophageal reflux disease without esophagitis: Secondary | ICD-10-CM | POA: Diagnosis present

## 2014-11-23 DIAGNOSIS — E871 Hypo-osmolality and hyponatremia: Secondary | ICD-10-CM | POA: Diagnosis present

## 2014-11-23 LAB — CBC WITH DIFFERENTIAL/PLATELET
BASOS ABS: 0 10*3/uL (ref 0.0–0.1)
BASOS PCT: 0 %
EOS ABS: 0.1 10*3/uL (ref 0.0–0.7)
Eosinophils Relative: 0 %
HEMATOCRIT: 44.2 % (ref 39.0–52.0)
Hemoglobin: 16.1 g/dL (ref 13.0–17.0)
Lymphocytes Relative: 13 %
Lymphs Abs: 1.7 10*3/uL (ref 0.7–4.0)
MCH: 31.1 pg (ref 26.0–34.0)
MCHC: 36.4 g/dL — AB (ref 30.0–36.0)
MCV: 85.5 fL (ref 78.0–100.0)
MONO ABS: 0.8 10*3/uL (ref 0.1–1.0)
MONOS PCT: 6 %
NEUTROS ABS: 10.8 10*3/uL — AB (ref 1.7–7.7)
NEUTROS PCT: 81 %
PLATELETS: 217 10*3/uL (ref 150–400)
RBC: 5.17 MIL/uL (ref 4.22–5.81)
RDW: 12.4 % (ref 11.5–15.5)
WBC: 13.3 10*3/uL — ABNORMAL HIGH (ref 4.0–10.5)

## 2014-11-23 LAB — COMPREHENSIVE METABOLIC PANEL
ALK PHOS: 68 U/L (ref 38–126)
ALT: 21 U/L (ref 17–63)
ANION GAP: 10 (ref 5–15)
AST: 22 U/L (ref 15–41)
Albumin: 4.5 g/dL (ref 3.5–5.0)
BILIRUBIN TOTAL: 1 mg/dL (ref 0.3–1.2)
BUN: 19 mg/dL (ref 6–20)
CALCIUM: 9.4 mg/dL (ref 8.9–10.3)
CO2: 22 mmol/L (ref 22–32)
CREATININE: 1.18 mg/dL (ref 0.61–1.24)
Chloride: 101 mmol/L (ref 101–111)
GFR calc non Af Amer: >60 mL/min (ref >60)
GLUCOSE: 102 mg/dL — AB (ref 65–99)
Potassium: 4 mmol/L (ref 3.5–5.1)
Sodium: 133 mmol/L — ABNORMAL LOW (ref 135–145)
TOTAL PROTEIN: 8 g/dL (ref 6.5–8.1)

## 2014-11-23 LAB — URINALYSIS, ROUTINE W REFLEX MICROSCOPIC
BILIRUBIN URINE: NEGATIVE
GLUCOSE, UA: NEGATIVE mg/dL
HGB URINE DIPSTICK: NEGATIVE
KETONES UR: 40 mg/dL — AB
Leukocytes, UA: NEGATIVE
Nitrite: NEGATIVE
PROTEIN: NEGATIVE mg/dL
Specific Gravity, Urine: 1.02 (ref 1.005–1.030)
UROBILINOGEN UA: 0.2 mg/dL (ref 0.0–1.0)
pH: 6.5 (ref 5.0–8.0)

## 2014-11-23 LAB — LIPASE, BLOOD: LIPASE: 29 U/L (ref 22–51)

## 2014-11-23 MED ORDER — ACETAMINOPHEN 325 MG PO TABS
650.0000 mg | ORAL_TABLET | Freq: Once | ORAL | Status: AC
  Administered 2014-11-23: 650 mg via ORAL
  Filled 2014-11-23: qty 2

## 2014-11-23 MED ORDER — HYDROMORPHONE HCL 1 MG/ML IJ SOLN
1.0000 mg | Freq: Once | INTRAMUSCULAR | Status: AC
  Administered 2014-11-23: 1 mg via INTRAVENOUS
  Filled 2014-11-23: qty 1

## 2014-11-23 MED ORDER — DOXYCYCLINE HYCLATE 100 MG IV SOLR
100.0000 mg | Freq: Once | INTRAVENOUS | Status: AC
  Administered 2014-11-24: 100 mg via INTRAVENOUS
  Filled 2014-11-23: qty 100

## 2014-11-23 MED ORDER — MORPHINE SULFATE (PF) 4 MG/ML IV SOLN
4.0000 mg | Freq: Once | INTRAVENOUS | Status: AC
  Administered 2014-11-23: 4 mg via INTRAVENOUS
  Filled 2014-11-23: qty 1

## 2014-11-23 MED ORDER — SODIUM CHLORIDE 0.9 % IV BOLUS (SEPSIS)
1000.0000 mL | Freq: Once | INTRAVENOUS | Status: AC
  Administered 2014-11-23: 1000 mL via INTRAVENOUS

## 2014-11-23 MED ORDER — HYDROMORPHONE HCL 1 MG/ML IJ SOLN: 1.0000 mg | Freq: Once | INTRAMUSCULAR | Status: DC

## 2014-11-23 NOTE — ED Notes (Signed)
Bed: [facility] Expected date:  Expected time:  Means of arrival:  Comments: Ems- back pain

## 2014-11-23 NOTE — ED Provider Notes (Signed)
CSN: [id number]     Arrival date & time 11/23/14  1746 History   First MD Initiated Contact with Patient 11/23/14 1804     Chief Complaint  Patient presents with  . Leg Pain  . Arm Pain     (Consider location/radiation/quality/duration/timing/severity/associated sxs/prior Treatment) HPI Eric Mcdonald is a 49 y.o. male who comes in for evaluation of left leg and bilateral arm pain. Patient states since last night he has had severe left leg pain that feels like somebody is stabbing him. Rates his discomfort as a 8.5/10. Reports inability to move his leg due to the discomfort. He reports associated burning sensation in his toes. Ambulation and moving seems to worsen his symptoms and nothing seems to make it better. He reports associated burning sensation from his elbow into his knuckles on both forearms. Patient reports that he does have a history of sciatica and denies any trauma or injury to affected areas. Denies any risk of STD. Patient seen in the ED 3 days ago for abdominal pain  History reviewed. No pertinent past medical history. Past Surgical History  Procedure Laterality Date  . Cholecystectomy  10/16/2008    stones   Family History  Problem Relation Age of Onset  . COPD Father     Emphysema  . Diabetes Maternal Grandmother   . Cancer Maternal Grandfather     lung  . Cancer Paternal Grandfather     lung cancer   Social History  Substance Use Topics  . Smoking status: Current Some Day Smoker -- 0.50 packs/day    Types: Cigarettes, Pipe, Cigars  . Smokeless tobacco: Never Used  . Alcohol Use: No    Review of Systems A 10 point review of systems was completed and was negative except for pertinent positives and negatives as mentioned in the history of present illness     Allergies  Peanuts  Home Medications   Prior to Admission medications   Medication Sig Start Date End Date Taking? Authorizing Provider  ibuprofen (ADVIL,MOTRIN) 200 MG tablet Take 200-600 mg by  mouth every 6 (six) hours as needed for headache or moderate pain.   Yes Historical Provider, MD  magnesium hydroxide (MILK OF MAGNESIA) 400 MG/5ML suspension Take 40 mLs by mouth daily as needed for mild constipation.   Yes Historical Provider, MD  ranitidine (ZANTAC) 150 MG tablet Take 150 mg by mouth 2 (two) times daily.   Yes Historical Provider, MD  sildenafil (VIAGRA) 100 MG tablet Take 1 tablet (100 mg total) by mouth daily as needed for erectile dysfunction. 10/24/14  Yes Salley Scarlet, MD  terbinafine (LAMISIL) 250 MG tablet Take 250 mg by mouth daily.   Yes Historical Provider, MD   BP 121/74 mmHg  Pulse 91  Temp(Src) 97.8 F (36.6 C) (Oral)  Resp 20  SpO2 98% Physical Exam  Constitutional: He is oriented to person, place, and time. He appears well-developed and well-nourished.  HENT:  Head: Normocephalic and atraumatic.  Mouth/Throat: Oropharynx is clear and moist.  Eyes: Conjunctivae are normal. Pupils are equal, round, and reactive to light. Right eye exhibits no discharge. Left eye exhibits no discharge. No scleral icterus.  Neck: Neck supple.  Cardiovascular: Normal rate, regular rhythm and normal heart sounds.   Pulmonary/Chest: Effort normal and breath sounds normal. No respiratory distress. He has no wheezes. He has no rales.  Abdominal: Soft. He exhibits no distension and no mass. There is no tenderness. There is no rebound and no guarding.  Musculoskeletal: He  exhibits no tenderness.  Complains of diffuse joint pain and bilateral elbows, wrists and throughout lower extremity joints, however there is no focal tenderness, erythema or swelling noted.  Neurological: He is alert and oriented to person, place, and time.  Cranial Nerves II-XII grossly intact  Skin: Skin is warm and dry. No rash noted.  Psychiatric: He has a normal mood and affect.  Nursing note and vitals reviewed.   ED Course  Procedures (including critical care time) Labs Review Labs Reviewed   COMPREHENSIVE METABOLIC PANEL - Abnormal; Notable for the following:    Sodium 133 (*)    Glucose, Bld 102 (*)    All other components within normal limits  CBC WITH DIFFERENTIAL/PLATELET - Abnormal; Notable for the following:    WBC 13.3 (*)    MCHC 36.4 (*)    Neutro Abs 10.8 (*)    All other components within normal limits  URINALYSIS, ROUTINE W REFLEX MICROSCOPIC (NOT AT [facility]) - Abnormal; Notable for the following:    Ketones, ur 40 (*)    All other components within normal limits  CULTURE, BLOOD (ROUTINE X 2)  CULTURE, BLOOD (ROUTINE X 2)  RESPIRATORY VIRUS PANEL  URINE CULTURE  LIPASE, BLOOD  ROCKY MTN SPOTTED FVR ABS PNL(IGG+IGM)  EHRLICHIA ANTIBODY PANEL  I-STAT CG4 LACTIC ACID, ED    Imaging Review Dg Chest 2 View  11/23/2014   CLINICAL DATA:  Bilateral arm pain.  EXAM: CHEST  2 VIEW  COMPARISON:  Chest x-rays dated 11/20/2014 and 12/27/2013  FINDINGS: The heart size and mediastinal contours are within normal limits. Both lungs are clear. The visualized skeletal structures are unremarkable.  IMPRESSION: Normal exam.   Electronically Signed   By: Francene Boyers M.D.   On: 11/23/2014 21:33   I have personally reviewed and evaluated these images and lab results as part of my medical decision-making.   EKG Interpretation None     Meds given in ED:  Medications  doxycycline (VIBRAMYCIN) 100 mg in dextrose 5 % 250 mL IVPB (not administered)  sodium chloride 0.9 % bolus 1,000 mL (not administered)  piperacillin-tazobactam (ZOSYN) IVPB 3.375 g (not administered)  vancomycin (VANCOCIN) IVPB 1000 mg/200 mL premix (not administered)  acetaminophen (TYLENOL) tablet 650 mg (650 mg Oral Given 11/23/14 1830)  morphine 4 MG/ML injection 4 mg (4 mg Intravenous Given 11/23/14 1848)  morphine 4 MG/ML injection 4 mg (4 mg Intravenous Given 11/23/14 2030)  HYDROmorphone (DILAUDID) injection 1 mg (1 mg Intravenous Given 11/23/14 2238)  HYDROmorphone (DILAUDID) injection 1 mg (1 mg  Intravenous Given 11/23/14 2336)  sodium chloride 0.9 % bolus 1,000 mL (1,000 mLs Intravenous New Bag/Given 11/23/14 2340)    New Prescriptions   No medications on file   Filed Vitals:   11/23/14 1757 11/23/14 1859 11/23/14 2216  BP: 136/76 129/73 121/74  Pulse: 94 98 91  Temp: 101.1 F (38.4 C)  97.8 F (36.6 C)  TempSrc: Oral  Oral  Resp: SpO2: 100% 99% 98%    MDM  Eric Mcdonald is a 49 y.o. male who comes in for evaluation of polyarthritis, diffuse leg pain for the past week. Has been seen by his PCP at [facility] urgent care center and also seen in the ED 3 days ago for abdominal pain without any definitive diagnoses. Patient is febrile in the ED to 101.28F, improved with oral Tylenol. Otherwise hemodynamically stable. On exam, patient has diffuse joint pains, worse in left lower extremity without any erythema,  swelling or overt warmth. Low suspicion for disseminated gonorrhea or other STD related infection. Chest x-ray normal. Basic labs show leukocytosis of 13.3, up from 11 two days ago. UA unremarkable. No elevation in LFTs or thrombocytopenia. However, given patient's polyarthritis and fever, will assume tick born illness and treat with doxycycline in the ED. Pending blood cultures. I believe patient would benefit from admission to medical service for further evaluation and management of patient's symptoms. Discussed patient presentation and ED course of my attending, Dr. Gwendolyn Grant who also saw and evaluated the patient and agrees with plan for medical admission. Discussed with Dr. Della Goo, patient admitted to medical service. Final diagnoses:  Fever, unspecified fever cause  Polyarthralgia       Joycie Peek, PA-C 11/24/14 0206  Elwin Mocha, MD 11/24/14 [phone]

## 2014-11-23 NOTE — ED Notes (Signed)
[facility] presents with a 49 yo male from [facility] with left leg pain since Saturday evening/Sunday morning and bilateral forearm pain since Tuesday evening.  Pt states he has a hx of sciatica.  No trauma to affected areas.

## 2014-11-23 NOTE — ED Notes (Signed)
Bed: [facility] Expected date:  Expected time:  Means of arrival:  Comments: Ems abdominal pain  

## 2014-11-23 NOTE — Telephone Encounter (Signed)
Pt called and states that his knee and foot swelled up really large last night and by this am it was gone. 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 He then stated that he drinks coffee all day and was wondering about dehydration. Informed pt to drink him some gatorade and increase his water intake and see how he does tonight and if no better by tomorrow to call and we would bring him back in to been seen. He verbalized understanding and will do the suggested.

## 2014-11-24 ENCOUNTER — Encounter: Payer: Self-pay | Admitting: Physician Assistant

## 2014-11-24 DIAGNOSIS — Z79899 Other long term (current) drug therapy: Secondary | ICD-10-CM | POA: Diagnosis not present

## 2014-11-24 DIAGNOSIS — Z91010 Allergy to peanuts: Secondary | ICD-10-CM | POA: Diagnosis not present

## 2014-11-24 DIAGNOSIS — M2550 Pain in unspecified joint: Secondary | ICD-10-CM

## 2014-11-24 DIAGNOSIS — E871 Hypo-osmolality and hyponatremia: Secondary | ICD-10-CM | POA: Diagnosis present

## 2014-11-24 DIAGNOSIS — D72829 Elevated white blood cell count, unspecified: Secondary | ICD-10-CM | POA: Diagnosis present

## 2014-11-24 DIAGNOSIS — A419 Sepsis, unspecified organism: Secondary | ICD-10-CM | POA: Diagnosis not present

## 2014-11-24 DIAGNOSIS — Z833 Family history of diabetes mellitus: Secondary | ICD-10-CM | POA: Diagnosis not present

## 2014-11-24 DIAGNOSIS — Z825 Family history of asthma and other chronic lower respiratory diseases: Secondary | ICD-10-CM | POA: Diagnosis not present

## 2014-11-24 DIAGNOSIS — R6510 Systemic inflammatory response syndrome (SIRS) of non-infectious origin without acute organ dysfunction: Secondary | ICD-10-CM | POA: Diagnosis present

## 2014-11-24 DIAGNOSIS — R509 Fever, unspecified: Secondary | ICD-10-CM | POA: Diagnosis present

## 2014-11-24 DIAGNOSIS — Z791 Long term (current) use of non-steroidal anti-inflammatories (NSAID): Secondary | ICD-10-CM | POA: Diagnosis not present

## 2014-11-24 DIAGNOSIS — M25539 Pain in unspecified wrist: Secondary | ICD-10-CM | POA: Diagnosis present

## 2014-11-24 DIAGNOSIS — K219 Gastro-esophageal reflux disease without esophagitis: Secondary | ICD-10-CM | POA: Diagnosis present

## 2014-11-24 DIAGNOSIS — M130 Polyarthritis, unspecified: Secondary | ICD-10-CM | POA: Diagnosis present

## 2014-11-24 DIAGNOSIS — K5900 Constipation, unspecified: Secondary | ICD-10-CM | POA: Diagnosis present

## 2014-11-24 DIAGNOSIS — Z801 Family history of malignant neoplasm of trachea, bronchus and lung: Secondary | ICD-10-CM | POA: Diagnosis not present

## 2014-11-24 DIAGNOSIS — R739 Hyperglycemia, unspecified: Secondary | ICD-10-CM | POA: Diagnosis present

## 2014-11-24 DIAGNOSIS — F17210 Nicotine dependence, cigarettes, uncomplicated: Secondary | ICD-10-CM | POA: Diagnosis present

## 2014-11-24 LAB — BASIC METABOLIC PANEL
ANION GAP: 9 (ref 5–15)
BUN: 17 mg/dL (ref 6–20)
CHLORIDE: 105 mmol/L (ref 101–111)
CO2: 19 mmol/L — AB (ref 22–32)
Calcium: 7.7 mg/dL — ABNORMAL LOW (ref 8.9–10.3)
Creatinine, Ser: 1.08 mg/dL (ref 0.61–1.24)
GFR calc non Af Amer: >60 mL/min (ref >60)
Glucose, Bld: 104 mg/dL — ABNORMAL HIGH (ref 65–99)
POTASSIUM: 3.7 mmol/L (ref 3.5–5.1)
Sodium: 133 mmol/L — ABNORMAL LOW (ref 135–145)

## 2014-11-24 LAB — CBC
HCT: 38.1 % — ABNORMAL LOW (ref 39.0–52.0)
HEMOGLOBIN: 13 g/dL (ref 13.0–17.0)
MCH: 30.2 pg (ref 26.0–34.0)
MCHC: 34.1 g/dL (ref 30.0–36.0)
MCV: 88.4 fL (ref 78.0–100.0)
Platelets: 189 10*3/uL (ref 150–400)
RBC: 4.31 MIL/uL (ref 4.22–5.81)
RDW: 12.6 % (ref 11.5–15.5)
WBC: 10.7 10*3/uL — ABNORMAL HIGH (ref 4.0–10.5)

## 2014-11-24 LAB — URIC ACID: URIC ACID, SERUM: 4.7 mg/dL (ref 4.4–7.6)

## 2014-11-24 LAB — SEDIMENTATION RATE: Sed Rate: 24 mm/hr — ABNORMAL HIGH (ref 0–16)

## 2014-11-24 LAB — I-STAT CG4 LACTIC ACID, ED: Lactic Acid, Venous: 0.77 mmol/L (ref 0.5–2.0)

## 2014-11-24 MED ORDER — ACETAMINOPHEN 325 MG PO TABS
650.0000 mg | ORAL_TABLET | Freq: Four times a day (QID) | ORAL | Status: DC | PRN
  Administered 2014-11-24 – 2014-11-25 (×2): 650 mg via ORAL
  Filled 2014-11-24: qty 2

## 2014-11-24 MED ORDER — PIPERACILLIN-TAZOBACTAM 3.375 G IVPB
3.3750 g | Freq: Three times a day (TID) | INTRAVENOUS | Status: DC
  Administered 2014-11-24 – 2014-11-25 (×4): 3.375 g via INTRAVENOUS
  Filled 2014-11-24 (×4): qty 50

## 2014-11-24 MED ORDER — OXYCODONE HCL 5 MG PO TABS
10.0000 mg | ORAL_TABLET | ORAL | Status: DC | PRN
Start: 2014-11-24 — End: 2014-11-27
  Administered 2014-11-24 – 2014-11-26 (×7): 10 mg via ORAL
  Filled 2014-11-24 (×7): qty 2

## 2014-11-24 MED ORDER — ONDANSETRON HCL 4 MG PO TABS: 4.0000 mg | ORAL_TABLET | Freq: Four times a day (QID) | ORAL | Status: DC | PRN

## 2014-11-24 MED ORDER — VANCOMYCIN HCL 10 G IV SOLR
1250.0000 mg | Freq: Two times a day (BID) | INTRAVENOUS | Status: DC
  Administered 2014-11-24 – 2014-11-25 (×3): 1250 mg via INTRAVENOUS
  Filled 2014-11-24 (×4): qty 1250

## 2014-11-24 MED ORDER — ENOXAPARIN SODIUM 40 MG/0.4ML ~~LOC~~ SOLN
40.0000 mg | Freq: Every day | SUBCUTANEOUS | Status: DC
  Administered 2014-11-24 – 2014-11-27 (×4): 40 mg via SUBCUTANEOUS
  Filled 2014-11-24 (×4): qty 0.4

## 2014-11-24 MED ORDER — ENOXAPARIN SODIUM 30 MG/0.3ML ~~LOC~~ SOLN: 30.0000 mg | SUBCUTANEOUS | Status: DC

## 2014-11-24 MED ORDER — DOXYCYCLINE HYCLATE 100 MG PO TABS
100.0000 mg | ORAL_TABLET | Freq: Two times a day (BID) | ORAL | Status: DC
  Administered 2014-11-24 – 2014-11-27 (×6): 100 mg via ORAL
  Filled 2014-11-24 (×6): qty 1

## 2014-11-24 MED ORDER — PIPERACILLIN-TAZOBACTAM 3.375 G IVPB 30 MIN
3.3750 g | Freq: Once | INTRAVENOUS | Status: AC
  Administered 2014-11-24: 3.375 g via INTRAVENOUS
  Filled 2014-11-24: qty 50

## 2014-11-24 MED ORDER — ACETAMINOPHEN 650 MG RE SUPP: 650.0000 mg | Freq: Four times a day (QID) | RECTAL | Status: DC | PRN

## 2014-11-24 MED ORDER — SODIUM CHLORIDE 0.9 % IV BOLUS (SEPSIS)
1000.0000 mL | INTRAVENOUS | Status: AC
  Administered 2014-11-24 (×3): 1000 mL via INTRAVENOUS

## 2014-11-24 MED ORDER — VANCOMYCIN HCL IN DEXTROSE 1-5 GM/200ML-% IV SOLN: 1000.0000 mg | Freq: Once | INTRAVENOUS | Status: DC

## 2014-11-24 MED ORDER — OXYCODONE HCL 5 MG PO TABS
5.0000 mg | ORAL_TABLET | ORAL | Status: DC | PRN
  Administered 2014-11-24: 5 mg via ORAL
  Filled 2014-11-24: qty 1

## 2014-11-24 MED ORDER — HYDROMORPHONE HCL 1 MG/ML IJ SOLN
0.5000 mg | INTRAMUSCULAR | Status: DC | PRN
  Administered 2014-11-24 – 2014-11-25 (×3): 1 mg via INTRAVENOUS
  Filled 2014-11-24 (×4): qty 1

## 2014-11-24 MED ORDER — ALUM & MAG HYDROXIDE-SIMETH 200-200-20 MG/5ML PO SUSP: 30.0000 mL | Freq: Four times a day (QID) | ORAL | Status: DC | PRN

## 2014-11-24 MED ORDER — ONDANSETRON HCL 4 MG/2ML IJ SOLN: 4.0000 mg | Freq: Four times a day (QID) | INTRAMUSCULAR | Status: DC | PRN

## 2014-11-24 MED ORDER — SODIUM CHLORIDE 0.9 % IV SOLN
INTRAVENOUS | Status: DC
  Administered 2014-11-24: 18:00:00 via INTRAVENOUS
  Administered 2014-11-24: 1000 mL via INTRAVENOUS
  Administered 2014-11-25 – 2014-11-26 (×2): via INTRAVENOUS

## 2014-11-24 NOTE — Progress Notes (Signed)
Patient seen and evaluated earlier the same by my associate. Please refer to H&P for details regarding assessment and plan.  Patient reports to me that he thinks that spiders. Him and his hands There are still painful and swollen.  Patient will undergo further workup. Will also add uric acid 2 workup  We'll reassess next a.m.  Patient still continuing pain at extremities as such will place on higher dose of OxyIR.  [employer], [employer]

## 2014-11-24 NOTE — Progress Notes (Signed)
Obtained report from Amy RN in ED.

## 2014-11-24 NOTE — H&P (Addendum)
[facility] Admission History and Physical       Eric Mcdonald IRW:[id number] DOB: 05-04-1965 DOA: 11/23/2014  Referring physician: EDP PCP: Vic Blackbird, MD  Specialists:   Chief Complaint: Fever Joint pain and Swelling  HPI: Eric Mcdonald is a 49 y.o. male who presents to the ED with complaints of pain and swelling of his left knee and Left ankle overnight which resolved by the AM, then he began to have pain in Both of his forearms and wrists and reports that he was unable to move his wrists due to the pain.   He was seen in the ED and also found to have a fever to 101.1.   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  A Sepsis/SIRS Workup was initiated and tIters for Lyme disease and RMSF were sent.  He was placed on IV Doxycyline and then Vancomycin and Zosyn.      Review of Systems:  Constitutional: No Weight Loss, No Weight Gain, Night Sweats, +Fevers, Chills, Dizziness, Light Headedness, Fatigue, or Generalized Weakness HEENT: No Headaches, Difficulty Swallowing,Tooth/Dental Problems,Sore Throat,  No Sneezing, Rhinitis, Ear Ache, Nasal Congestion, or Post Nasal Drip,  Cardio-vascular:  No Chest pain, Orthopnea, PND, Edema in Lower Extremities, Anasarca, Dizziness, Palpitations  Resp: No Dyspnea, No DOE, No Productive Cough, No Non-Productive Cough, No Hemoptysis, No Wheezing.    GI: No Heartburn, Indigestion, +Abdominal Pain, Nausea, Vomiting, Diarrhea, Constipation, Hematemesis, Hematochezia, Melena, Change in Bowel Habits,  Loss of Appetite  GU: No Dysuria, No Change in Color of Urine, No Urgency or Urinary Frequency, No Flank pain.  Musculoskeletal: +Bilateral Wrist and ForeARM Pain, +Left Knee and Left Ankle Swelling, No Back Pain.  Neurologic: No Syncope, No Seizures, Muscle Weakness, Paresthesia, Vision Disturbance or Loss, No Diplopia, No Vertigo, No Difficulty Walking,  Skin: No Rash or Lesions. Psych: No Change in  Mood or Affect, No Depression or Anxiety, No Memory loss, No Confusion, or Hallucinations   History reviewed. No pertinent past medical history.   Past Surgical History  Procedure Laterality Date  . Cholecystectomy  10/16/2008    stones      Prior to Admission medications   Medication Sig Start Date End Date Taking? Authorizing Provider  ibuprofen (ADVIL,MOTRIN) 200 MG tablet Take 200-600 mg by mouth every 6 (six) hours as needed for headache or moderate pain.   Yes Historical Provider, MD  magnesium hydroxide (MILK OF MAGNESIA) 400 MG/5ML suspension Take 40 mLs by mouth daily as needed for mild constipation.   Yes Historical Provider, MD  ranitidine (ZANTAC) 150 MG tablet Take 150 mg by mouth 2 (two) times daily.   Yes Historical Provider, MD  sildenafil (VIAGRA) 100 MG tablet Take 1 tablet (100 mg total) by mouth daily as needed for erectile dysfunction. 10/24/14  Yes Alycia Rossetti, MD  terbinafine (LAMISIL) 250 MG tablet Take 250 mg by mouth daily.   Yes Historical Provider, MD     Allergies  Allergen Reactions  . Peanuts [Peanut Oil]     Bothers galbladder, but can peanuts not since the galbladder got removed    Social History:  reports that he has been smoking Cigarettes, Pipe, and Cigars.  He has been smoking about 0.50 packs per day. He has never used smokeless tobacco. He reports that he does not drink alcohol or use illicit drugs.    Family History  Problem Relation Age of Onset  . COPD Father  Emphysema  . Diabetes Maternal Grandmother   . Cancer Maternal Grandfather     lung  . Cancer Paternal Grandfather     lung cancer       Physical Exam:  GEN:  Pleasant Well Nourished and Well Developed 49 y.o. Caucasian male examined and in no acute distress; cooperative with exam Filed Vitals:   11/23/14 1757 11/23/14 1859 11/23/14 2216  BP: 136/76 129/73 121/74  Pulse: 94 98 91  Temp: 101.1 F (38.4 C)  97.8 F (36.6 C)  TempSrc: Oral  Oral  Resp: _0 SpO2: 100% 99% 98%   Blood pressure 121/74, pulse 91, temperature 97.8 F (36.6 C), temperature source Oral, resp. rate 20, SpO2 98 %. PSYCH: He is alert and oriented x4; does not appear anxious does not appear depressed; affect is normal HEENT: Normocephalic and Atraumatic, Mucous membranes pink; PERRLA; EOM intact; Fundi:  Benign;  No scleral icterus, Nares: Patent, Oropharynx: Clear, Fair Dentition,    Neck:  FROM, No Cervical Lymphadenopathy nor Thyromegaly or Carotid Bruit; No JVD; Breasts:: Not examined CHEST WALL: No tenderness CHEST: Normal respiration, clear to auscultation bilaterally HEART: Regular rate and rhythm; no murmurs rubs or gallops BACK: No kyphosis or scoliosis; No CVA tenderness ABDOMEN: Positive Bowel Sounds, Soft Non-Tender, No Rebound or Guarding; No Masses, No Organomegaly Rectal Exam: Not done EXTREMITIES: No Cyanosis, Clubbing, or Edema; No Ulcerations. Genitalia: not examined PULSES: 2+ and symmetric SKIN: Normal hydration no rash or ulceration CNS:  Alert and Oriented x 4, No Focal Deficits Vascular: pulses palpable throughout    Labs on Admission:  Basic Metabolic Panel:  Recent Labs Lab 11/20/14 2322 11/23/14 1902  NA 135 133*  K 3.8 4.0  CL 98* 101  CO2 28 22  GLUCOSE 107* 102*  BUN 11 19  CREATININE 1.18 1.18  CALCIUM 9.1 9.4   Liver Function Tests:  Recent Labs Lab 11/20/14 2322 11/23/14 1902  AST 21 22  ALT 27 21  ALKPHOS 63 68  BILITOT 0.8 1.0  PROT 7.3 8.0  ALBUMIN 4.2 4.5    Recent Labs Lab 11/20/14 2322 11/23/14 1902  LIPASE 37 29   No results for input(s): AMMONIA in the last 168 hours. CBC:  Recent Labs Lab 11/20/14 2322 11/23/14 1902  WBC 11.5* 13.3*  NEUTROABS 8.4* 10.8*  HGB 16.4 16.1  HCT 45.5 44.2  MCV 87.0 85.5  PLT 191 217   Cardiac Enzymes: No results for input(s): CKTOTAL, CKMB, CKMBINDEX, TROPONINI in the last 168 hours.  BNP (last 3 results) No results for input(s): BNP in the last 8760  hours.  ProBNP (last 3 results) No results for input(s): PROBNP in the last 8760 hours.  CBG: No results for input(s): GLUCAP in the last 168 hours.  Radiological Exams on Admission: Dg Chest 2 View  11/23/2014   CLINICAL DATA:  Bilateral arm pain.  EXAM: CHEST  2 VIEW  COMPARISON:  Chest x-rays dated 11/20/2014 and 12/27/2013  FINDINGS: The heart size and mediastinal contours are within normal limits. Both lungs are clear. The visualized skeletal structures are unremarkable.  IMPRESSION: Normal exam.   Electronically Signed   By: Lorriane Shire M.D.   On: 11/23/2014 21:33      Assessment/Plan:      49 y.o. male with  Principal Problem:   1.     SIRS (systemic inflammatory response syndrome)/Fever   Sepsis workup initiated   IV Vancomycin and Zosyn   IV Doxycyline x 1,  Then PO   Active Problems:   2.    Polyarthralgia-    Check RSMF and Lyme Titers   Check ESR   Empiric Doxycycline Rx     3.    Leukocytosis   Monitor Trend     4.    Hyponatremia   IVFs with NSS     5.    DVT Prophylaxis   Lovenox     Code Status:     FULL CODE      Family Communication:  No Family Present    Disposition Plan:    Inpatient  Status        Time spent:  [facility] Pager [phone]   If [place] Please Contact the Day Rounding Team MD for [facility]  If 7PM-7AM, Please Contact Night-Floor Coverage  [email or web address] Password [facility] 11/24/2014, 12:23 AM     ADDENDUM:   Patient was seen and examined on 11/24/2014

## 2014-11-24 NOTE — Progress Notes (Signed)
ANTIBIOTIC CONSULT NOTE - INITIAL  Pharmacy Consult for Zosyn/Vancomcyin Indication: Sepsis  Allergies  Allergen Reactions  . Peanuts [Peanut Oil]     Bothers galbladder, but can peanuts not since the galbladder got removed    Patient Measurements:   Wt=91 kg  Vital Signs: Temp: 97.8 F (36.6 C) (09/21 2216) Temp Source: Oral (09/21 2216) BP: 121/74 mmHg (09/21 2216) Pulse Rate: 91 (09/21 2216) Intake/Output from previous day:   Intake/Output from this shift:    Labs:  Recent Labs  11/23/14 1902  WBC 13.3*  HGB 16.1  PLT 217  CREATININE 1.18   Estimated Creatinine Clearance: 83.1 mL/min (by C-G formula based on Cr of 1.18). No results for input(s): VANCOTROUGH, VANCOPEAK, VANCORANDOM, GENTTROUGH, GENTPEAK, GENTRANDOM, TOBRATROUGH, TOBRAPEAK, TOBRARND, AMIKACINPEAK, AMIKACINTROU, AMIKACIN in the last 72 hours.   Microbiology: No results found for this or any previous visit (from the past 720 hour(s)).  Medical History: History reviewed. No pertinent past medical history.  Medications:   (Not in a hospital admission) Scheduled:   Infusions:  . doxycycline (VIBRAMYCIN) IV    . piperacillin-tazobactam    . sodium chloride    . vancomycin     Assessment: 54 yoM with left leg pain since Sat pm/Sun am and bilateral forearm pain since Tues pm.  Zosyn/Vancomycin per Rx for Sepsis.   Goal of Therapy:  Vancomycin trough level 15-20 mcg/ml  Plan:   Zosyn 3.375 Gm IV q8h EI  Vancomcyin 1Gm x1 then  IV q12h  F/u SCr/cultures/levels  Susanne Greenhouse R 11/24/2014,12:23 AM

## 2014-11-24 NOTE — Progress Notes (Signed)
Patient ID: BLISS TSANG MRN: [id number], DOB: [birth year], 49 y.o. Date of Encounter: @  Chief Complaint:  Chief Complaint  Patient presents with  . Pain legs and arms    Pt thinks Lamisil has done something to his body as in poisoned him!?!    HPI: 49 y.o. year old white male  presents with above symptoms.   He had called and spoken to nursing staff here in our office earlier today. Once he got to our office for this appointment, he was unable to walk into the office himself and staff had to help him get into the office using a wheelchair. He was sitting in wheelchair during the time that I was obtaining history. He states that last Wednesday he had pain in his right upper quadrant, " went to the ER was told that he was dehydrated his liver was okay and everything was okay  and they just sent him home " Says "how could everything be okay if my abdomen was hurting. They tried to tell me was a pulled muscle-- doesn't feel like any pulled muscle to me"  Then discusses the fact that he has chronic problems with L4-L5. Says that because of his other symptoms he had spent the day in the [place] and thinks that may have aggravated his low back.  Then discusses that now his left foot hurts and his left knee hurts.  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 Says Tuesday when he left from soccer, while driving home, both of his forearms suddenly "locked up" -- he does not explain this very well-- but basically just says that the forearm area of both arms suddenly developed significant pain and locked up and he could not really move them/control them.  Says Tuesday night his left knee was swollen huge and his left foot was swollen. Says he woke up Wednesday morning and the swelling was gone. Says "explain that to me--- anytime I've had any type of sprain or anything it takes weeks for the swelling to go away--and the next da the swelling is just gone?"  "What is going on with  my body?"   "It's like I've ingested some type of poison or something is affecting my whole body"  Then I began to review records in Epic but he kept interrupting me so I was going to go to another area to review these--- prior to leaving his exam room, he had started shouting out with pain saying that he needed to get on the table so he can lay and stretch out. However he was unable to get himself up from the wheelchair to the exam table so I went to get staff to help assist him with this while I went to review records.  I then reviewed the following records:  ER notes from 11/20/14. This included CT abdomen pelvis as well as chest x-ray. Also labs including CBC CME T lipase.  Physical therapy note 11/21/2014  Office visit note with Dr. Tanya Nones 11/22/14. That visit was in follow-up of his ER visit and in follow-up of his abdominal pain and findings on tests performed at the ER.  Physical therapy note 11/22/14  I then went back to patient's exam room. Patient still had not made it up onto the exam table. Our nursing staff was assisting him, but he was yelling out in pain, standing facing the exam table, holding to the exam table. Finally, he was able to get on the exam table.  Asked him if he was able to actually perform physical therapy at those physical therapy appointments----I did not understand how he is now screaming out in pain, unable to move, but just went to physical therapy yesterday. I did examine his left knee and left foot. Left knee does have some mild erythema, mild swelling, and is slightly warm to the touch. Inspection of the left foot shows no rash or lesion or erythema. However, he is screaming out in pain.  I was planning to check CBC CME T TSH CK sedimentation rate CRP Lyme and [facility] spotted fever titers. However after going back into his room, seeing that he could not even get onto the exam table, and continued to scream out in pain, it was decided we would have to  call EMS for transfer to the ER. He drove himself here to the office and has no one to drive him.    History reviewed. No pertinent past medical history.   Home Meds: No facility-administered medications prior to visit.   Outpatient Prescriptions Prior to Visit  Medication Sig Dispense Refill  . magnesium hydroxide (MILK OF MAGNESIA) 400 MG/5ML suspension Take 40 mLs by mouth daily as needed for mild constipation.    . ranitidine (ZANTAC) 150 MG tablet Take 150 mg by mouth 2 (two) times daily.    . sildenafil (VIAGRA) 100 MG tablet Take 1 tablet (100 mg total) by mouth daily as needed for erectile dysfunction. 10 tablet 11  . dicyclomine (BENTYL) 20 MG tablet Take 1 tablet (20 mg total) by mouth every 8 (eight) hours as needed (abdominal pain). 20 tablet 0  . levofloxacin (LEVAQUIN) 500 MG tablet Take 1 tablet (500 mg total) by mouth daily. 6 tablet 0  . ondansetron (ZOFRAN ODT) 8 MG disintegrating tablet Take 1 tablet (8 mg total) by mouth every 8 (eight) hours as needed for nausea or vomiting. 12 tablet 0  . terbinafine (LAMISIL) 250 MG tablet Take 250 mg by mouth daily.      Allergies:  Allergies  Allergen Reactions  . Peanuts [Peanut Oil]     Bothers galbladder, but can peanuts not since the galbladder got removed    Social History   Social History  . Marital Status: Married    Spouse Name: N/A  . Number of Children: N/A  . Years of Education: N/A   Occupational History  . Not on file.   Social History Main Topics  . Smoking status: Current Some Day Smoker -- 0.50 packs/day    Types: Cigarettes, Pipe, Cigars  . Smokeless tobacco: Never Used  . Alcohol Use: No  . Drug Use: No  . Sexual Activity: Not Currently   Other Topics Concern  . Not on file   Social History Narrative    Family History  Problem Relation Age of Onset  . COPD Father     Emphysema  . Diabetes Maternal Grandmother   . Cancer Maternal Grandfather     lung  . Cancer Paternal Grandfather       lung cancer     Review of Systems:  See HPI for pertinent ROS. All other ROS negative.    Physical Exam: Blood pressure 98/60, pulse 68, temperature 98.1 F (36.7 C), temperature source Oral, resp. rate 18., There is no weight on file to calculate BMI. General: WNWD WM. Appears in no acute distress but at times yells out in pain. Neck: Supple. No thyromegaly. No lymphadenopathy. Lungs: Clear bilaterally to auscultation without wheezes, rales, or  rhonchi. Breathing is unlabored. Heart: RRR with S1 S2. No murmurs, rubs, or gallops. Abdomen: Unable to examine as he could not transfer to exam table secondary to pain Musculoskeletal:  Strength and tone normal for age. Left Knee: Mild swelling, mild erythema, mild warmth. Left Foot: No swelling, no erythema, no rash. Extremities/Skin: Warm and dry. Neuro: Alert and oriented X 3. Moves all extremities spontaneously. Gait is normal. CNII-XII grossly in tact. Psych:  Affect seems quite dramatic.      ASSESSMENT AND PLAN:  49 y.o. year old male with  1. Bilateral arm pain - CBC with Differential/Platelet - COMPLETE METABOLIC PANEL WITH GFR - TSH - Rocky mtn spotted fvr abs pnl(IgG+IgM) - B. burgdorfi antibodies by WB - Sedimentation rate - CK  2. Left knee pain - CBC with Differential/Platelet - COMPLETE METABOLIC PANEL WITH GFR - TSH - Rocky mtn spotted fvr abs pnl(IgG+IgM) - B. burgdorfi antibodies by WB - Sedimentation rate - CK  3. Left foot pain - CBC with Differential/Platelet - COMPLETE METABOLIC PANEL WITH GFR - TSH - Rocky mtn spotted fvr abs pnl(IgG+IgM) - B. burgdorfi antibodies by WB - Sedimentation rate - CK  Had placed orders for the above labs but after further evaluation determined that he needed to go to the ER via EMS. EMS transported patient from our office. Patient was in stable condition when he left the office. 45 minutes was spent with this patient in direct contact as well as time spent  reviewing the above records.   Signed, [address] [place], [place], [facility] 11/24/2014 8:22 AM

## 2014-11-24 NOTE — Progress Notes (Signed)
Paged Dr Cena Benton per pt request to speak to MD about spider bites.

## 2014-11-25 DIAGNOSIS — K219 Gastro-esophageal reflux disease without esophagitis: Secondary | ICD-10-CM

## 2014-11-25 DIAGNOSIS — D72829 Elevated white blood cell count, unspecified: Secondary | ICD-10-CM

## 2014-11-25 DIAGNOSIS — A419 Sepsis, unspecified organism: Secondary | ICD-10-CM

## 2014-11-25 DIAGNOSIS — M2550 Pain in unspecified joint: Secondary | ICD-10-CM

## 2014-11-25 DIAGNOSIS — E871 Hypo-osmolality and hyponatremia: Secondary | ICD-10-CM

## 2014-11-25 LAB — URINE CULTURE: Culture: NO GROWTH

## 2014-11-25 LAB — ROCKY MTN SPOTTED FVR ABS PNL(IGG+IGM)
RMSF IgG: NEGATIVE
RMSF IgM: 0.42 index (ref 0.00–0.89)

## 2014-11-25 LAB — EHRLICHIA ANTIBODY PANEL
E CHAFFEENSIS AB, IGG: NEGATIVE
E CHAFFEENSIS AB, IGM: NEGATIVE
E. CHAFFEENSIS (HME) IGM TITER: NEGATIVE
E.Chaffeensis (HME) IgG: NEGATIVE

## 2014-11-25 LAB — C-REACTIVE PROTEIN: CRP: 20.2 mg/dL — ABNORMAL HIGH (ref <1.0)

## 2014-11-25 MED ORDER — PANTOPRAZOLE SODIUM 40 MG PO TBEC
40.0000 mg | DELAYED_RELEASE_TABLET | Freq: Every day | ORAL | Status: DC
  Administered 2014-11-25 – 2014-11-26 (×2): 40 mg via ORAL
  Filled 2014-11-25 (×2): qty 1

## 2014-11-25 MED ORDER — METHYLPREDNISOLONE SODIUM SUCC 40 MG IJ SOLR
40.0000 mg | Freq: Two times a day (BID) | INTRAMUSCULAR | Status: DC
  Administered 2014-11-25 – 2014-11-27 (×5): 40 mg via INTRAVENOUS
  Filled 2014-11-25 (×5): qty 1

## 2014-11-25 NOTE — Progress Notes (Signed)
[facility] PROGRESS NOTE  Eric Mcdonald DDU:[id number] DOB: 07/06/1965 DOA: 11/23/2014 PCP: Vic Blackbird, MD  Assessment/Plan: 1-presumed SIRS: due to fever and mild WBC's on admission -no signs of infection -will stop broad spectrum -will continue IVF's (but will decrease rate) -will give steroids trial -follow cx data and continue doxycycline  2-polyarthralgia:  -will check CRP and Rheumatoid fx -ESR mildly elevated -will give steroids trial -follow response -follow Lyme and RMSF markers  3-GERD: will continue PPI  4-leukocytosis: appears to be secondary to demargination -worse now with steroids trial -will follow trend  5-hyponatremia: resolved with IVF's -will follow trend   Code Status: Full Family Communication: friend at bedside  Disposition Plan: will stop antibiotics, give steroids trial; remains inpatient.   Consultants:  None   Procedures:  See below for x-ray reports   Antibiotics:  vanc and zosyn 9/21>>9/23  HPI/Subjective: Afebrile, no CP, no SOB. Patient complaining of joint pain, swelling and stiffness   Objective: Filed Vitals:   11/25/14 2037  BP: 135/85  Pulse: 81  Temp: 97.9 F (36.6 C)  Resp: 18    Intake/Output Summary (Last 24 hours) at 11/25/14 2215 Last data filed at 11/25/14 2037  Gross per 24 hour  Intake   4046 ml  Output   1100 ml  Net   2946 ml   Filed Weights   11/24/14 0139  Weight: 94.711 kg (208 lb 12.8 oz)    Exam:   General:  NAD, afebrile, complaining of stiff/painful joints (affecting left knee, elbows, wrists and left ankle). No dysuria   Cardiovascular: S1 and S2, no rubs or gallops  Respiratory: no wheezing, good air movement   Abdomen: soft, NT, ND, positive BS  Musculoskeletal: multiple joint sweelling and stiffness   Data Reviewed: Basic Metabolic Panel:  Recent Labs Lab 11/20/14 2322 11/23/14 1902 11/24/14 0440  NA 135 133* 133*  K 3.8 4.0 3.7  CL 98* 101 105  CO2  28 22 19*  GLUCOSE 107* 102* 104*  BUN $Re'11 19 17  'VhZ$ CREATININE 1.18 1.18 1.08  CALCIUM 9.1 9.4 7.7*   Liver Function Tests:  Recent Labs Lab 11/20/14 2322 11/23/14 1902  AST 21 22  ALT 27 21  ALKPHOS 63 68  BILITOT 0.8 1.0  PROT 7.3 8.0  ALBUMIN 4.2 4.5    Recent Labs Lab 11/20/14 2322 11/23/14 1902  LIPASE 37 29   CBC:  Recent Labs Lab 11/20/14 2322 11/23/14 1902 11/24/14 0440  WBC 11.5* 13.3* 10.7*  NEUTROABS 8.4* 10.8*  --   HGB 16.4 16.1 13.0  HCT 45.5 44.2 38.1*  MCV 87.0 85.5 88.4  PLT 191 217 189   CBG: No results for input(s): GLUCAP in the last 168 hours.  Recent Results (from the past 240 hour(s))  Urine culture     Status: None   Collection Time: 11/23/14 10:16 PM  Result Value Ref Range Status   Specimen Description URINE, CLEAN CATCH  Final   Special Requests NONE  Final   Culture   Final    NO GROWTH 1 DAY Performed at [facility]    Report Status 11/25/2014 FINAL  Final  Blood culture (routine x 2)     Status: None (Preliminary result)   Collection Time: 11/24/14 12:15 AM  Result Value Ref Range Status   Specimen Description BLOOD LEFT ANTECUBITAL  Final   Special Requests BOTTLES DRAWN AEROBIC AND ANAEROBIC 5CC  Final   Culture   Final    NO GROWTH 1  DAY Performed at [facility]    Report Status PENDING  Incomplete  Blood culture (routine x 2)     Status: None (Preliminary result)   Collection Time: 11/24/14 12:20 AM  Result Value Ref Range Status   Specimen Description BLOOD BLOOD RIGHT HAND  Final   Special Requests BOTTLES DRAWN AEROBIC AND ANAEROBIC 5CC  Final   Culture   Final    NO GROWTH 1 DAY Performed at [facility]    Report Status PENDING  Incomplete     Studies: No results found.  Scheduled Meds: . doxycycline  100 mg Oral Q12H  . enoxaparin (LOVENOX) injection  40 mg Subcutaneous Daily  . methylPREDNISolone (SOLU-MEDROL) injection  40 mg Intravenous Q12H  . pantoprazole  40 mg Oral  Q1200   Continuous Infusions: . sodium chloride 75 mL/hr at 11/25/14 1414    Principal Problem:   SIRS (systemic inflammatory response syndrome) Active Problems:   Fever   Polyarthralgia   Leukocytosis   Hyponatremia   Pyrexia    Time spent: 30 minutes     Barton Dubois  [facility] Pager [phone] If 7PM-7AM, please contact night-coverage at [email or web address], password [facility]  11/25/2014, 10:15 PM  LOS: 1 day

## 2014-11-26 LAB — BASIC METABOLIC PANEL
ANION GAP: 11 (ref 5–15)
BUN: 14 mg/dL (ref 6–20)
CALCIUM: 8.8 mg/dL — AB (ref 8.9–10.3)
CO2: 22 mmol/L (ref 22–32)
Chloride: 102 mmol/L (ref 101–111)
Creatinine, Ser: 0.96 mg/dL (ref 0.61–1.24)
GFR calc non Af Amer: >60 mL/min (ref >60)
GLUCOSE: 138 mg/dL — AB (ref 65–99)
POTASSIUM: 3.7 mmol/L (ref 3.5–5.1)
Sodium: 135 mmol/L (ref 135–145)

## 2014-11-26 LAB — RESPIRATORY VIRUS PANEL
Adenovirus: NEGATIVE
INFLUENZA A: NEGATIVE
Influenza B: NEGATIVE
Metapneumovirus: NEGATIVE
PARAINFLUENZA 2 A: NEGATIVE
Parainfluenza 1: NEGATIVE
Parainfluenza 3: NEGATIVE
RESPIRATORY SYNCYTIAL VIRUS B: NEGATIVE
Respiratory Syncytial Virus A: NEGATIVE
Rhinovirus: NEGATIVE

## 2014-11-26 LAB — RHEUMATOID FACTOR: Rhuematoid fact SerPl-aCnc: 12.3 IU/mL (ref 0.0–13.9)

## 2014-11-26 NOTE — Progress Notes (Signed)
[facility] PROGRESS NOTE  PHILIPPE GANG DSK:[id number] DOB: 05/26/1965 DOA: 11/23/2014 PCP: Vic Blackbird, MD  Assessment/Plan: 1-presumed SIRS: due to fever and mild WBC's on admission -no signs of infection -remains afebrile off abx's -will change IVF's to [facility] -will continue steroids trial -follow cx data and continue doxycycline  2-polyarthralgia:  -CRP is very high and Rheumatoid fx borderline elevated -ESR mildly elevated -will continue steroids trial -patient with excellent response  -follow Lyme and RMSF markers  3-GERD: will continue PPI  4-leukocytosis: appears to be secondary to demargination -worse now with steroids trial -will follow trend  5-hyponatremia: resolved with IVF's -will follow trend   Code Status: Full Family Communication: friend at bedside  Disposition Plan: will ask PT to evaluate patient capacity for ADL's, continue steroid one more day; most likely home in am.   Consultants:  None   Procedures:  See below for x-ray reports   Antibiotics:  vanc and zosyn 9/21>>9/23  HPI/Subjective: Afebrile, no CP, no SOB. Patient with dramatic improvement in his joint pain, swelling and stiffness   Objective: Filed Vitals:   11/26/14 0451  BP: 137/80  Pulse: 72  Temp: 98 F (36.7 C)  Resp: 16    Intake/Output Summary (Last 24 hours) at 11/26/14 1134 Last data filed at 11/26/14 0900  Gross per 24 hour  Intake   1700 ml  Output    900 ml  Net    800 ml   Filed Weights   11/24/14 0139  Weight: 94.711 kg (208 lb 12.8 oz)    Exam:   General:  NAD, afebrile, reports dramatic improvement in his symptoms; less stiffness, decrease swelling and practically no pain.    Cardiovascular: S1 and S2, no rubs or gallops  Respiratory: no wheezing, good air movement   Abdomen: soft, NT, ND, positive BS  Musculoskeletal: increase range of motion, decrease joint swelling    Data Reviewed: Basic Metabolic Panel:  Recent  Labs Lab 11/20/14 2322 11/23/14 1902 11/24/14 0440 11/26/14 0515  NA 135 133* 133* 135  K 3.8 4.0 3.7 3.7  CL 98* 101 105 102  CO2 28 22 19* 22  GLUCOSE 107* 102* 104* 138*  BUN $Re'11 19 17 14  'wru$ CREATININE 1.18 1.18 1.08 0.96  CALCIUM 9.1 9.4 7.7* 8.8*   Liver Function Tests:  Recent Labs Lab 11/20/14 2322 11/23/14 1902  AST 21 22  ALT 27 21  ALKPHOS 63 68  BILITOT 0.8 1.0  PROT 7.3 8.0  ALBUMIN 4.2 4.5    Recent Labs Lab 11/20/14 2322 11/23/14 1902  LIPASE 37 29   CBC:  Recent Labs Lab 11/20/14 2322 11/23/14 1902 11/24/14 0440  WBC 11.5* 13.3* 10.7*  NEUTROABS 8.4* 10.8*  --   HGB 16.4 16.1 13.0  HCT 45.5 44.2 38.1*  MCV 87.0 85.5 88.4  PLT 191 217 189   CBG: No results for input(s): GLUCAP in the last 168 hours.  Recent Results (from the past 240 hour(s))  Urine culture     Status: None   Collection Time: 11/23/14 10:16 PM  Result Value Ref Range Status   Specimen Description URINE, CLEAN CATCH  Final   Special Requests NONE  Final   Culture   Final    NO GROWTH 1 DAY Performed at [facility]    Report Status 11/25/2014 FINAL  Final  Blood culture (routine x 2)     Status: None (Preliminary result)   Collection Time: 11/24/14 12:15 AM  Result Value Ref Range  Status   Specimen Description BLOOD LEFT ANTECUBITAL  Final   Special Requests BOTTLES DRAWN AEROBIC AND ANAEROBIC 5CC  Final   Culture   Final    NO GROWTH 1 DAY Performed at [facility]    Report Status PENDING  Incomplete  Blood culture (routine x 2)     Status: None (Preliminary result)   Collection Time: 11/24/14 12:20 AM  Result Value Ref Range Status   Specimen Description BLOOD BLOOD RIGHT HAND  Final   Special Requests BOTTLES DRAWN AEROBIC AND ANAEROBIC 5CC  Final   Culture   Final    NO GROWTH 1 DAY Performed at [facility]    Report Status PENDING  Incomplete  Respiratory virus panel     Status: None   Collection Time: 11/24/14  6:59 AM  Result  Value Ref Range Status   Respiratory Syncytial Virus A Negative Negative Final   Respiratory Syncytial Virus B Negative Negative Final   Influenza A Negative Negative Final   Influenza B Negative Negative Final   Parainfluenza 1 Negative Negative Final   Parainfluenza 2 Negative Negative Final   Parainfluenza 3 Negative Negative Final   Metapneumovirus Negative Negative Final   Rhinovirus Negative Negative Final   Adenovirus Negative Negative Final    Comment: (NOTE) Performed At: [facility] [place], [place] [id number] Lindon Romp MD KC:[id number]      Studies: No results found.  Scheduled Meds: . doxycycline  100 mg Oral Q12H  . enoxaparin (LOVENOX) injection  40 mg Subcutaneous Daily  . methylPREDNISolone (SOLU-MEDROL) injection  40 mg Intravenous Q12H  . pantoprazole  40 mg Oral Q1200   Continuous Infusions: . sodium chloride 75 mL/hr at 11/26/14 1033    Principal Problem:   SIRS (systemic inflammatory response syndrome) Active Problems:   Fever   Polyarthralgia   Leukocytosis   Hyponatremia   Pyrexia    Time spent: 30 minutes     Barton Dubois  [facility] Pager [phone] If 7PM-7AM, please contact night-coverage at [email or web address], password [facility]  11/26/2014, 11:34 AM  LOS: 2 days

## 2014-11-26 NOTE — Evaluation (Signed)
Physical Therapy One Time Evaluation Patient Details Name: Eric Mcdonald MRN: [id number] DOB: [birth year] Today's Date: 11/26/2014   History of Present Illness  Pt is a 49 year old male admitted with SIRS (systemic inflammatory response syndrome)/Fever and polyarthralgias  Clinical Impression  Patient evaluated by Physical Therapy with no further acute PT needs identified. All education has been completed and the patient has no further questions.  Pt reports decreased swelling of joints and improved mobility since change in meds to steroids.  Some edema still visually present in wrists and hands however pt able to brush teeth and wash hands at sink without difficulty.   Pt performed half squat and able to tolerate ambulating around unit.  Pt reports overall feeling much better.  Pt does have hx of R sciatica however has had outpatient therapy in past to assist with this and considering SPC to assist with pain management during acute episodes so educated on appropriate cane positioning. See below for any follow-up Physical Therapy or equipment needs. PT is signing off. Thank you for this referral.     Follow Up Recommendations No PT follow up    Equipment Recommendations  None recommended by PT    Recommendations for Other Services       Precautions / Restrictions Precautions Precautions: None      Mobility  Bed Mobility Overal bed mobility: Needs Assistance Bed Mobility: Supine to Sit;Sit to Supine     Supine to sit: Independent Sit to supine: Independent      Transfers Overall transfer level: Independent                  Ambulation/Gait Ambulation/Gait assistance: Supervision;Modified independent (Device/Increase time) Ambulation Distance (Feet): 400 Feet Assistive device: None Gait Pattern/deviations: Step-through pattern     General Gait Details: slow but steady gait, pt pushed IV pole, discussed proper SPC technique as pt reports thinking about obtaining  cane for when having increased sciatica pain  Stairs            Wheelchair Mobility    Modified Rankin (Stroke Patients Only)       Balance                                             Pertinent Vitals/Pain Pain Assessment: No/denies pain    Home Living Family/patient expects to be discharged to:: Private residence Living Arrangements: Alone   Type of Home: House       Home Layout: One level Home Equipment: None      Prior Function Level of Independence: Independent               Hand Dominance        Extremity/Trunk Assessment   Upper Extremity Assessment: RUE deficits/detail;LUE deficits/detail ( (much improved since starting steroids per pt)) RUE Deficits / Details: decreased fingers and wrist flexion/extension due to edema however able to perform thumb to each digit     LUE Deficits / Details: decreased fingers and wrist flexion/extension due to edema however able to perform thumb to each digit   Lower Extremity Assessment: RLE deficits/detail;LLE deficits/detail ( (much improved since starting steroids per pt)) RLE Deficits / Details: hx of sciatica per pt however no present issues, WFL for tasks assessed LLE Deficits / Details: reports increased ankle and knee swelling upon admission however much improved, has full ROM throughout  Communication   Communication: No difficulties  Cognition Arousal/Alertness: Awake/alert Behavior During Therapy: WFL for tasks assessed/performed Overall Cognitive Status: Within Functional Limits for tasks assessed                      General Comments      Exercises        Assessment/Plan    PT Assessment Patent does not need any further PT services  PT Diagnosis     PT Problem List    PT Treatment Interventions     PT Goals (Current goals can be found in the Care Plan section) Acute Rehab PT Goals PT Goal Formulation: All assessment and education complete, DC  therapy    Frequency     Barriers to discharge        Co-evaluation               End of Session   Activity Tolerance: Patient tolerated treatment well Patient left: in bed;with call bell/phone within reach           Time: 1541-1605 PT Time Calculation (min) (ACUTE ONLY): 24 min   Charges:   PT Evaluation $Initial PT Evaluation Tier I: 1 Procedure     PT G Codes:        Ajay Strubel,KATHrine E 11/26/2014, 4:47 PM Zenovia Jarred, PT, DPT 11/26/2014 Pager: [phone]

## 2014-11-27 DIAGNOSIS — R509 Fever, unspecified: Secondary | ICD-10-CM

## 2014-11-27 DIAGNOSIS — R531 Weakness: Secondary | ICD-10-CM

## 2014-11-27 LAB — BASIC METABOLIC PANEL
Anion gap: 6 (ref 5–15)
BUN: 18 mg/dL (ref 6–20)
CHLORIDE: 108 mmol/L (ref 101–111)
CO2: 25 mmol/L (ref 22–32)
CREATININE: 0.81 mg/dL (ref 0.61–1.24)
Calcium: 8.6 mg/dL — ABNORMAL LOW (ref 8.9–10.3)
GFR calc Af Amer: >60 mL/min (ref >60)
GFR calc non Af Amer: >60 mL/min (ref >60)
Glucose, Bld: 152 mg/dL — ABNORMAL HIGH (ref 65–99)
POTASSIUM: 4.1 mmol/L (ref 3.5–5.1)
Sodium: 139 mmol/L (ref 135–145)

## 2014-11-27 MED ORDER — OXYCODONE HCL 5 MG PO TABS: 5.0000 mg | ORAL_TABLET | Freq: Four times a day (QID) | ORAL | Status: DC | PRN

## 2014-11-27 MED ORDER — PREDNISONE 20 MG PO TABS: ORAL_TABLET | ORAL | Status: DC

## 2014-11-27 MED ORDER — PANTOPRAZOLE SODIUM 40 MG PO TBEC: 40.0000 mg | DELAYED_RELEASE_TABLET | Freq: Every day | ORAL | Status: DC

## 2014-11-27 NOTE — Discharge Summary (Signed)
Physician Discharge Summary  Eric Mcdonald MCR:[id number] DOB: 09-06-1965 DOA: 11/23/2014  PCP: Eric Blackbird, MD  Admit date: 11/23/2014 Discharge date: 11/27/2014  Time spent: 40 Minutes  Recommendations for Outpatient Follow-up:  1. Repeat CBG and check A1C 2.arrange follow up with rheumatology service 3. follow final results of Lyme and RMSF markers  Discharge Diagnoses:  Systemic inflammatory response syndrome) Fever Polyarthralgia Leukocytosis Hyponatremia Hyperglycemia GERD   Discharge Condition: stable and improved.Eric Mcdonald Discharge home with instructions to follow with PCP in 10 days  Diet recommendation: regular diet   Filed Weights   11/24/14 0139  Weight: 94.711 kg (208 lb 12.8 oz)    History of present illness:  49 y.o. male who presents to the ED with complaints of pain and swelling of his left knee and Left ankle overnight which resolved by the AM, then he began to have pain in Both of his forearms and wrists and reports that he was unable to move his wrists due to the pain. He was seen in the ED and also found to have a fever to 101.1. 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 A Sepsis/SIRS Workup was initiated and tIters for Lyme disease and RMSF were sent.  Hospital Course:  1-presumed SIRS: due to fever and mild WBC's on admission -no signs of infection or toxic appearance  -remains afebrile off abx's -will encourage to have good hydration -will continue steroids tapering   2-polyarthralgia:  -CRP is very high and Rheumatoid fx borderline elevated -ESR just mildly elevated -will continue steroids trial/tapering at discharge -patient will benefit of referral to rheumatoid specialist in outpatient setting for further evaluation/treatment. -patient with excellent response (at discharge had no joint swelling or stiffness, and huud -follow Lyme and RMSF markers  3-GERD: will continue  PPI  4-leukocytosis: appears to be secondary to demargination -repeat CBC as an outpatient to guarantee resolution -slight elevation seen with use of steroids   -no signs of infection  5-hyponatremia: resolved with IVF's -will check BMET during follow up visit   6-hyperglycemia: most likely associated with steroids -will recommend repeat of CBG's and A1C check during outpatient follow up  Procedures:  See below for x-ray reports   Consultations:  None   Discharge Exam: Filed Vitals:   11/27/14 0540  BP: 127/69  Pulse: 57  Temp: 98.5 F (36.9 C)  Resp: 16    General: afebrile, no CP or SOB. Patient with dramatic improvement in his joint stiffness and swelling. FROM on exam Cardiovascular: S1 and S2, no rubs o rgallops Respiratory: CTA bilaterally Abd: soft, NT, ND, positive BS Extremities: no edema, cyanosis or clubbing   Discharge Instructions   Discharge Instructions    Discharge instructions    Complete by:  As directed   Follow up with PCP in 10 days Take medications as prescribed Keep yourself well hydrated          Current Discharge Medication List    START taking these medications   Details  oxyCODONE (OXY IR/ROXICODONE) 5 MG immediate release tablet Take 1 tablet (5 mg total) by mouth every 6 (six) hours as needed for severe pain. Qty: 30 tablet, Refills: 0    pantoprazole (PROTONIX) 40 MG tablet Take 1 tablet (40 mg total) by mouth daily at 12 noon. Qty: 30 tablet, Refills: 1    predniSONE (DELTASONE) 20 MG tablet Take 2 tablets by mouth daily for 3 days; then 1 tablet by mouth daily  for 3 days; then 1/2 tablet by mouth daily for 3 days and stop prednisone Qty: 12 tablet, Refills: 0      CONTINUE these medications which have NOT CHANGED   Details  ibuprofen (ADVIL,MOTRIN) 200 MG tablet Take 200-600 mg by mouth every 6 (six) hours as needed for headache or moderate pain.    magnesium hydroxide (MILK OF MAGNESIA) 400 MG/5ML suspension Take 40  mLs by mouth daily as needed for mild constipation.    ranitidine (ZANTAC) 150 MG tablet Take 150 mg by mouth 2 (two) times daily.    sildenafil (VIAGRA) 100 MG tablet Take 1 tablet (100 mg total) by mouth daily as needed for erectile dysfunction. Qty: 10 tablet, Refills: 11      STOP taking these medications     terbinafine (LAMISIL) 250 MG tablet        Allergies  Allergen Reactions  . Peanuts [Peanut Oil]     Bothers galbladder, but can peanuts not since the galbladder got removed   Follow-up Information    Follow up with Eric Blackbird, MD. Schedule an appointment as soon as possible for a visit in 10 days.   Specialty:  Family Medicine   Contact information:   [address] [place] [address] [phone]       The results of significant diagnostics from this hospitalization (including imaging, microbiology, ancillary and laboratory) are listed below for reference.    Significant Diagnostic Studies: Dg Chest 2 View  11/23/2014   CLINICAL DATA:  Bilateral arm pain.  EXAM: CHEST  2 VIEW  COMPARISON:  Chest x-rays dated 11/20/2014 and 12/27/2013  FINDINGS: The heart size and mediastinal contours are within normal limits. Both lungs are clear. The visualized skeletal structures are unremarkable.  IMPRESSION: Normal exam.   Electronically Signed   By: Lorriane Shire M.D.   On: 11/23/2014 21:33   Ct Abdomen Pelvis W Contrast  11/21/2014   CLINICAL DATA:  Low back pain. Abdominal pain starting Friday. Right-sided abdominal pain.  EXAM: CT ABDOMEN AND PELVIS WITH CONTRAST  TECHNIQUE: Multidetector CT imaging of the abdomen and pelvis was performed using the standard protocol following bolus administration of intravenous contrast.  CONTRAST:  150mL OMNIPAQUE IOHEXOL 300 MG/ML  SOLN  COMPARISON:  None.  FINDINGS: Emphysematous changes and slight scarring in the lung bases. Noncalcified nodule in the right lung base measuring 5.5 mm. If the patient is at high risk for  bronchogenic carcinoma, follow-up chest CT at 6-12 months is recommended. If the patient is at low risk for bronchogenic carcinoma, follow-up chest CT at 12 months is recommended. This recommendation follows the consensus statement: Guidelines for Management of Small Pulmonary Nodules Detected on CT Scans: A Statement from the [place] as published in Radiology 2005;237:395-400.  Surgical absence of the gallbladder. No bile duct dilatation. Mild diffuse fatty infiltration of the liver. No focal liver lesions. The pancreas, spleen, adrenal glands, kidneys, abdominal aorta, inferior vena cava, and retroperitoneal lymph nodes are unremarkable. Stomach, small bowel, and colon are not abnormally distended. Liquid stool throughout the colon may be associated with diarrhea. No colonic wall thickening is appreciated. No free air or free fluid in the abdomen.  Pelvis: The appendix is normal. Prostate gland is not significantly enlarged. Prostate calcifications. Scattered diverticula in the sigmoid colon. No evidence of diverticulitis. Bladder wall is not thickened. No pelvic mass or lymphadenopathy. No free or loculated pelvic fluid collections. Degenerative changes in the spine and hips. No destructive bone  lesions.  IMPRESSION: Mild diffuse fatty infiltration of the liver. 5.5 mm nodule in the right lung base. See above followup recommendations. Liquid stool in the colon. No acute process otherwise identified.   Electronically Signed   By: Lucienne Capers M.D.   On: 11/21/2014 01:42   Dg Abd Acute W/chest  11/20/2014   CLINICAL DATA:  Abdominal pain, severe pain mostly RIGHT upper quadrant, constipation, smoker  EXAM: DG ABDOMEN ACUTE W/ 1V CHEST  COMPARISON:  Chest radiograph 12/27/2013, abdominal radiographs 10/18/2007  FINDINGS: Normal heart size, mediastinal contours, and pulmonary vascularity.  Suspected RIGHT middle lobe infiltrate.  Remaining lungs clear.  Central peribronchial thickening.  No pleural  effusion or pneumothorax.  Surgical clips RIGHT upper quadrant likely cholecystectomy.  Normal bowel gas pattern.  No bowel dilatation, bowel wall thickening, or free intraperitoneal air.  Scattered pelvic phleboliths.  No acute osseous findings.  IMPRESSION: No acute abdominal findings.  Bronchitic changes with suspected RIGHT middle lobe infiltrate suspicious for pneumonia.   Electronically Signed   By: Lavonia Dana M.D.   On: 11/20/2014 23:54    Microbiology: Recent Results (from the past 240 hour(s))  Urine culture     Status: None   Collection Time: 11/23/14 10:16 PM  Result Value Ref Range Status   Specimen Description URINE, CLEAN CATCH  Final   Special Requests NONE  Final   Culture   Final    NO GROWTH 1 DAY Performed at [facility]    Report Status 11/25/2014 FINAL  Final  Blood culture (routine x 2)     Status: None (Preliminary result)   Collection Time: 11/24/14 12:15 AM  Result Value Ref Range Status   Specimen Description BLOOD LEFT ANTECUBITAL  Final   Special Requests BOTTLES DRAWN AEROBIC AND ANAEROBIC 5CC  Final   Culture   Final    NO GROWTH 2 DAYS Performed at [facility]    Report Status PENDING  Incomplete  Blood culture (routine x 2)     Status: None (Preliminary result)   Collection Time: 11/24/14 12:20 AM  Result Value Ref Range Status   Specimen Description BLOOD BLOOD RIGHT HAND  Final   Special Requests BOTTLES DRAWN AEROBIC AND ANAEROBIC 5CC  Final   Culture   Final    NO GROWTH 2 DAYS Performed at [facility]    Report Status PENDING  Incomplete  Respiratory virus panel     Status: None   Collection Time: 11/24/14  6:59 AM  Result Value Ref Range Status   Respiratory Syncytial Virus A Negative Negative Final   Respiratory Syncytial Virus B Negative Negative Final   Influenza A Negative Negative Final   Influenza B Negative Negative Final   Parainfluenza 1 Negative Negative Final   Parainfluenza 2 Negative Negative Final    Parainfluenza 3 Negative Negative Final   Metapneumovirus Negative Negative Final   Rhinovirus Negative Negative Final   Adenovirus Negative Negative Final    Comment: (NOTE) Performed At: [facility] [place], [place] [id number] Lindon Romp MD IF:[id number]      Labs: Basic Metabolic Panel:  Recent Labs Lab 11/20/14 2322 11/23/14 1902 11/24/14 0440 11/26/14 0515 11/27/14 0414  NA 135 133* 133* 135 139  K 3.8 4.0 3.7 3.7 4.1  CL 98* 101 105 102 108  CO2 28 22 19* 22 25  GLUCOSE 107* 102* 104* 138* 152*  BUN $Re'11 19 17 14 18  'eOB$ CREATININE 1.18 1.18 1.08 0.96 0.81  CALCIUM 9.1 9.4 7.7* 8.8* 8.6*   Liver Function Tests:  Recent Labs Lab 11/20/14 2322 11/23/14 1902  AST 21 22  ALT 27 21  ALKPHOS 63 68  BILITOT 0.8 1.0  PROT 7.3 8.0  ALBUMIN 4.2 4.5    Recent Labs Lab 11/20/14 2322 11/23/14 1902  LIPASE 37 29   CBC:  Recent Labs Lab 11/20/14 2322 11/23/14 1902 11/24/14 0440  WBC 11.5* 13.3* 10.7*  NEUTROABS 8.4* 10.8*  --   HGB 16.4 16.1 13.0  HCT 45.5 44.2 38.1*  MCV 87.0 85.5 88.4  PLT 191 217 189    Signed:  Barton Dubois  [facility] 11/27/2014, 8:39 AM

## 2014-11-29 ENCOUNTER — Encounter (HOSPITAL_COMMUNITY): Payer: Self-pay | Admitting: *Deleted

## 2014-11-29 ENCOUNTER — Emergency Department (HOSPITAL_COMMUNITY)
Admission: EM | Admit: 2014-11-29 | Discharge: 2014-11-29 | Disposition: A | Discharge status: Home / Self Care | Payer: BC Managed Care – PPO | Source: Home / Self Care | Attending: Family Medicine | Admitting: Family Medicine

## 2014-11-29 DIAGNOSIS — R6510 Systemic inflammatory response syndrome (SIRS) of non-infectious origin without acute organ dysfunction: Secondary | ICD-10-CM

## 2014-11-29 DIAGNOSIS — M2550 Pain in unspecified joint: Secondary | ICD-10-CM

## 2014-11-29 DIAGNOSIS — A419 Sepsis, unspecified organism: Secondary | ICD-10-CM | POA: Diagnosis not present

## 2014-11-29 LAB — POCT RAPID STREP A: Streptococcus, Group A Screen (Direct): NEGATIVE

## 2014-11-29 LAB — CULTURE, BLOOD (ROUTINE X 2)
CULTURE: NO GROWTH
CULTURE: NO GROWTH

## 2014-11-29 NOTE — ED Provider Notes (Signed)
CSN: [id number]     Arrival date & time 11/29/14  1810 History   First MD Initiated Contact with Patient 11/29/14 1855     Chief Complaint  Patient presents with  . Sore Throat   (Consider location/radiation/quality/duration/timing/severity/associated sxs/prior Treatment) Patient is a 49 y.o. male presenting with pharyngitis. The history is provided by the patient.  Sore Throat This is a new problem. The current episode started more than 1 week ago. The problem occurs constantly. The problem has been gradually worsening. Associated symptoms include abdominal pain. Nothing aggravates the symptoms. The symptoms are relieved by medications. The treatment provided moderate relief.   Is a 49 year old gentleman who is in the process of skin divorce from his wife. 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  Patient was in his usual state of health until about 2 weeks ago when he developed abdominal pain. He was admitted to the hospital and multiple tests were carried out with no diagnosis for the abdominal pain. He then developed some leg pain and more recently diffuse arthralgias with swollen joints.  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 At about the last period in school, he had severe joint pain which was excruciating and he came home and has been having soreness in all his joints since. He developed a sore throat as well. History reviewed. No pertinent past medical history. Past Surgical History  Procedure Laterality Date  . Cholecystectomy  10/16/2008    stones   Family History  Problem Relation Age of Onset  . COPD Father     Emphysema  . Diabetes Maternal Grandmother   . Cancer Maternal Grandfather     lung  . Cancer Paternal  Grandfather     lung cancer   Social History  Substance Use Topics  . Smoking status: Current Some Day Smoker -- 0.50 packs/day    Types: Cigarettes, Pipe, Cigars  . Smokeless tobacco: Never Used  . Alcohol Use: No    Review of Systems  Constitutional: Positive for activity change and appetite change.  HENT: Negative.   Respiratory: Negative.   Cardiovascular: Negative.   Gastrointestinal: Positive for abdominal pain. Negative for nausea, diarrhea and constipation.  Endocrine: Negative.   Musculoskeletal: Positive for myalgias, back pain, joint swelling, arthralgias and neck pain.  Neurological: Negative for numbness.  Psychiatric/Behavioral: Negative.        Patient has been depressed in recent weeks and under great stress with the dissolution of his marriage.    Allergies  Peanuts  Home Medications   Prior to Admission medications   Medication Sig Start Date End Date Taking? Authorizing Provider  ibuprofen (ADVIL,MOTRIN) 200 MG tablet Take 200-600 mg by mouth every 6 (six) hours as needed for headache or moderate pain.    Historical Provider, MD  magnesium hydroxide (MILK OF MAGNESIA) 400 MG/5ML suspension Take 40 mLs by mouth daily as needed for mild constipation.    Historical Provider, MD  oxyCODONE (OXY IR/ROXICODONE) 5 MG immediate release tablet Take 1 tablet (5 mg total) by mouth every 6 (six) hours as needed for severe pain. 11/27/14   Vassie Loll, MD  pantoprazole (PROTONIX) 40 MG tablet Take 1 tablet (40 mg total) by mouth daily at 12 noon. 11/27/14   Mikle Bosworth  Gwenlyn Perking, MD  predniSONE (DELTASONE) 20 MG tablet Take 2 tablets by mouth daily for 3 days; then 1 tablet by mouth daily for 3 days; then 1/2 tablet by mouth daily for 3 days and stop prednisone 11/27/14   Vassie Loll, MD  ranitidine (ZANTAC) 150 MG tablet Take 150 mg by mouth 2 (two) times daily.    Historical Provider, MD  sildenafil (VIAGRA) 100 MG tablet Take 1 tablet (100 mg total) by mouth daily as needed for  erectile dysfunction. 10/24/14   Salley Scarlet, MD   Meds Ordered and Administered this Visit  Medications - No data to display  BP 172/98 mmHg  Pulse 58  Temp(Src) 99 F (37.2 C) (Oral)  Resp 18  SpO2 100% No data found.   Physical Exam  Constitutional: He is oriented to person, place, and time. He appears well-developed and well-nourished.  HENT:  Head: Normocephalic and atraumatic.  Right Ear: External ear normal.  Left Ear: External ear normal.  Mild erythema in the posterior pharynx  Eyes: Conjunctivae and EOM are normal. Pupils are equal, round, and reactive to light.  Neck: Normal range of motion. Neck supple. No JVD present. No tracheal deviation present. No thyromegaly present.  Cardiovascular: Normal rate, regular rhythm, normal heart sounds and intact distal pulses.   Pulmonary/Chest: Effort normal and breath sounds normal. No stridor.  Abdominal: Soft. There is no tenderness.  Musculoskeletal:  Patient has a deformed right middle finger from a past fracture. He has great difficulty making a fist on either hand.  Patient has no tenderness over the temporal areas of his scalp  Lymphadenopathy:    He has no cervical adenopathy.  Neurological: He is alert and oriented to person, place, and time.  Skin: Skin is warm and dry.  Psychiatric: He has a normal mood and affect. His behavior is normal. Judgment and thought content normal.  Nursing note and vitals reviewed.   ED Course  Procedures (including critical care time)  Labs Review Labs Reviewed  POCT RAPID STREP A   Results for orders placed or performed during the hospital encounter of 11/29/14  POCT rapid strep A [facility])  Result Value Ref Range   Streptococcus, Group A Screen (Direct) NEGATIVE NEGATIVE     MDM   Assessment: 49 year old man who is in good health until 2 weeks ago when he developed an acute rheumatic syndrome, undifferentiated this time, which has gotten worse today when he  reduced his prednisone as directed.  Plan: Patient has a follow-up appointment on Friday which he needs to keep. He will need a rheumatology referral at that time. In the meantime he needs to increase his prednisone to 3 times a day and stay on that dose until his appointment.  Signed, Elvina Sidle M.D.  Elvina Sidle, MD 11/29/14 [phone]

## 2014-11-29 NOTE — Discharge Instructions (Signed)
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 No need to stay on high dose prednisone until the diagnosis is established. This means taking prednisone 20 mg 3 times a day until your next office appointment on Friday. At that time plans we made to possibly taper this to right and get you seen by a rheumatologist. Until then you should stay out of work.

## 2014-11-29 NOTE — ED Notes (Signed)
Pt  Reports   sorethroat  And  Sensation of  Swelling         In throat                    Pt  Reports  Body  Aches   Recently  Released  From  Hospital  And  Is  Taking  Prednisone

## 2014-11-30 ENCOUNTER — Other Ambulatory Visit: Payer: Self-pay | Admitting: Family Medicine

## 2014-11-30 DIAGNOSIS — M353 Polymyalgia rheumatica: Secondary | ICD-10-CM

## 2014-12-01 ENCOUNTER — Other Ambulatory Visit: Payer: Self-pay | Admitting: Family Medicine

## 2014-12-01 DIAGNOSIS — R6510 Systemic inflammatory response syndrome (SIRS) of non-infectious origin without acute organ dysfunction: Secondary | ICD-10-CM

## 2014-12-01 MED ORDER — PREDNISONE 20 MG PO TABS: 20.0000 mg | ORAL_TABLET | Freq: Three times a day (TID) | ORAL | Status: DC

## 2014-12-01 NOTE — Progress Notes (Signed)
Per Dr. Milus Glazier send in new rx for Prednisone  1 po tid #25. He had him increase prednisone when seen on 11/29/2014 (see OV)

## 2014-12-02 ENCOUNTER — Ambulatory Visit (INDEPENDENT_AMBULATORY_CARE_PROVIDER_SITE_OTHER): Payer: BC Managed Care – PPO | Admitting: Family Medicine

## 2014-12-02 ENCOUNTER — Encounter: Payer: Self-pay | Admitting: Family Medicine

## 2014-12-02 VITALS — BP 140/90 | HR 86 | Temp 97.5°F | Resp 18 | Ht 72.0 in | Wt 199.0 lb

## 2014-12-02 DIAGNOSIS — E871 Hypo-osmolality and hyponatremia: Secondary | ICD-10-CM

## 2014-12-02 DIAGNOSIS — M353 Polymyalgia rheumatica: Secondary | ICD-10-CM

## 2014-12-02 LAB — COMPREHENSIVE METABOLIC PANEL
ALT: 17 U/L (ref 9–46)
AST: 10 U/L (ref 10–40)
Albumin: 3.8 g/dL (ref 3.6–5.1)
Alkaline Phosphatase: 61 U/L (ref 40–115)
BUN: 21 mg/dL (ref 7–25)
CHLORIDE: 101 mmol/L (ref 98–110)
CO2: 27 mmol/L (ref 20–31)
CREATININE: 0.88 mg/dL (ref 0.60–1.35)
Calcium: 9.3 mg/dL (ref 8.6–10.3)
Glucose, Bld: 105 mg/dL — ABNORMAL HIGH (ref 70–99)
Potassium: 4.5 mmol/L (ref 3.5–5.3)
SODIUM: 137 mmol/L (ref 135–146)
TOTAL PROTEIN: 6.6 g/dL (ref 6.1–8.1)
Total Bilirubin: 0.3 mg/dL (ref 0.2–1.2)

## 2014-12-02 LAB — CULTURE, GROUP A STREP

## 2014-12-02 NOTE — Patient Instructions (Addendum)
Return a FMLA  Prednisone- Starting Sunday - in morning,  in afternoon,  at night - for 1 week  Then  in AM,  in afternoon and at night  For 1 week  Then  three times a day for  1 week  Then  twice a day for 3 days, then 1 a day for 3 days then stop  We will call with lab results  F/U pending results

## 2014-12-02 NOTE — Assessment & Plan Note (Signed)
Recheck sodium levels 

## 2014-12-02 NOTE — ED Notes (Signed)
Final report of strep positive for beta hemolytic strep, untreated, discussed w Dr Rayvon Char. authorized Clindamycin 300 mg PO, TID , quant 21. Prior record has pharmacy of choice listed as [facility]. Called and left detailed message on store answering machine for staff. Called and left detailed VM on patient phone to notify of test results and to advise Rx has been called in to pharmacy for him

## 2014-12-02 NOTE — Progress Notes (Signed)
Patient ID: Eric Mcdonald, male   DOB: May 10, 1965, 49 y.o.   MRN: [id number]   Subjective:    Patient ID: Eric Mcdonald, male    DOB: 08/31/1965, 49 y.o.   MRN: [id number]  Patient presents for Hospital F/U  issue here for hospital follow-up. He initially presented with abdominal pain of unknown origin workup was negative. There was concern he may have pneumonia or upper respiratory this was also ruled out. He continued to worsen with joint pain and swelling which started in the fingers hand he states that his hands were pretty much contracted he cannot use them he also had pain in the knees and ankles. He was treated for [facility] spotted fever as he had a couple of erythematous papules on his palms but his titers returned negative. He was admitted for a few days and started on high-dose steroid-induced for polyarthralgia of unknown origin he also had a fever and service reaction. He actually improved significantly with the IV sterile therefore was sent home on prednisone with a taper over the next week. He started to taper down he began to have the same symptoms and began feeling ill at work therefore he went to the urgent care. He was advised to go back to the higher dose of prednisone 60 mg total a day until he came into my office visit. Rheumatology referral was also ordered. No family history of Rhematoid disease He has been under a lot of stress with separation from his wife    Review Of Systems:  GEN- denies fatigue, fever, weight loss,weakness, recent illness HEENT- denies eye drainage, change in vision, nasal discharge, CVS- denies chest pain, palpitations RESP- denies SOB, cough, wheeze ABD- denies N/V, change in stools, abd pain GU- denies dysuria, hematuria, dribbling, incontinence MSK- + joint pain, muscle aches, injury Neuro- denies headache, dizziness, syncope, seizure activity       Objective:    BP 140/90 mmHg  Pulse 86  Temp(Src) 97.5 F (36.4 C) (Oral)  Resp  18  Ht 6' (1.829 m)  Wt 199 lb (90.266 kg)  BMI 26.98 kg/m2 GEN- NAD, alert and oriented x3 HEENT- PERRL, EOMI, non injected sclera, pink conjunctiva, MMM, oropharynx clear Neck- Supple, no thyromegaly CVS- RRR, no murmur RESP-CTAB ABD-NABS,soft,NT,ND SKin- bruising on RLQ abdomen (heparin shots), Abrasion on right forearm  MSK- mild swelling of MIP Right hand, good ROM elbows, shoulders, knees, decreased grasp strength  EXT- No edema Pulses- Radial 2+        Assessment & Plan:      Problem List Items Addressed This Visit    Hyponatremia    Recheck sodium levels       Other Visit Diagnoses    Polymyalgia rheumatica syndrome    -  Primary    Unclear cause , ? viral mediated, he was also on terbinafine but no elevated LFT and abdominal pain resolved, Tick bone illness ruled out, RF neg, but elevated CRP/ESR. As he flared up significantly when he tried to taper down on the prednisone wherein have to go very slow. We are also getting him an urgent appointment with rheumatology. At this time and I have him complete 60 mg total prednisone daily until Sunday then he will decrease to 50 mg I'm going to decrease his evening dose first to see if this will help with the insomnia that the prednisone is causing. I will also give him another week out of work as we try to taper the steroids . 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     Relevant Orders    CBC with Differential/Platelet    Comprehensive metabolic panel       Note: This dictation was prepared with Dragon dictation along with smaller phrase technology. Any transcriptional errors that result from this process are unintentional.

## 2014-12-03 LAB — CBC WITH DIFFERENTIAL/PLATELET
BASOS PCT: 0 % (ref 0–1)
Basophils Absolute: 0 10*3/uL (ref 0.0–0.1)
EOS ABS: 0 10*3/uL (ref 0.0–0.7)
Eosinophils Relative: 0 % (ref 0–5)
HCT: 45.8 % (ref 39.0–52.0)
Hemoglobin: 15.6 g/dL (ref 13.0–17.0)
Lymphocytes Relative: 10 % — ABNORMAL LOW (ref 12–46)
Lymphs Abs: 1.6 10*3/uL (ref 0.7–4.0)
MCH: 30.1 pg (ref 26.0–34.0)
MCHC: 34.1 g/dL (ref 30.0–36.0)
MCV: 88.4 fL (ref 78.0–100.0)
MONOS PCT: 7 % (ref 3–12)
MPV: 8.6 fL (ref 8.6–12.4)
Monocytes Absolute: 1.1 10*3/uL — ABNORMAL HIGH (ref 0.1–1.0)
NEUTROS PCT: 83 % — AB (ref 43–77)
Neutro Abs: 13.4 10*3/uL — ABNORMAL HIGH (ref 1.7–7.7)
PLATELETS: 454 10*3/uL — AB (ref 150–400)
RBC: 5.18 MIL/uL (ref 4.22–5.81)
RDW: 13.5 % (ref 11.5–15.5)
WBC: 16.1 10*3/uL — AB (ref 4.0–10.5)

## 2014-12-07 ENCOUNTER — Telehealth: Payer: Self-pay | Admitting: Family Medicine

## 2014-12-07 NOTE — Telephone Encounter (Signed)
Pt is taking  twice a day and aware on Friday to start taking  1/2 tablet twice daily as instructed.

## 2014-12-07 NOTE — Progress Notes (Signed)
lmtrc to pt  

## 2014-12-07 NOTE — Telephone Encounter (Signed)
    Call pt, I want to clarify the dose of prednisone , is he on  twice a day now ?  If so, starting Friday - go down to  twice a day

## 2014-12-10 ENCOUNTER — Other Ambulatory Visit: Payer: Self-pay | Admitting: Emergency Medicine

## 2014-12-10 ENCOUNTER — Ambulatory Visit (INDEPENDENT_AMBULATORY_CARE_PROVIDER_SITE_OTHER): Payer: BC Managed Care – PPO | Admitting: Emergency Medicine

## 2014-12-10 VITALS — BP 119/81 | HR 84 | Temp 97.8°F | Resp 16 | Ht 72.0 in | Wt 200.0 lb

## 2014-12-10 DIAGNOSIS — M353 Polymyalgia rheumatica: Secondary | ICD-10-CM

## 2014-12-10 DIAGNOSIS — B370 Candidal stomatitis: Secondary | ICD-10-CM | POA: Diagnosis not present

## 2014-12-10 DIAGNOSIS — J029 Acute pharyngitis, unspecified: Secondary | ICD-10-CM | POA: Diagnosis not present

## 2014-12-10 DIAGNOSIS — K921 Melena: Secondary | ICD-10-CM | POA: Diagnosis not present

## 2014-12-10 MED ORDER — PREDNISONE 20 MG PO TABS: 20.0000 mg | ORAL_TABLET | Freq: Three times a day (TID) | ORAL | Status: DC

## 2014-12-10 MED ORDER — FLUCONAZOLE 100 MG PO TABS: ORAL_TABLET | ORAL | Status: DC

## 2014-12-10 NOTE — Patient Instructions (Signed)
Thrush, Adult  Thrush, also called oral candidiasis, is a fungal infection that develops in the mouth and throat and on the tongue. It causes white patches to form on the mouth and tongue. Thrush is most common in older adults, but it can occur at any age.   Many cases of thrush are mild, but this infection can also be more serious. Thrush can be a recurring problem for people who have chronic illnesses or who take medicines that limit the body's ability to fight infection. Because these people have difficulty fighting infections, the fungus that causes thrush can spread throughout the body. This can cause life-threatening blood or organ infections.  CAUSES   Thrush is usually caused by a yeast called Candida albicans. This fungus is normally present in small amounts in the mouth and on other mucous membranes. It usually causes no harm. However, when conditions are present that allow the fungus to grow uncontrolled, it invades surrounding tissues and becomes an infection. Less often, other Candida species can also lead to thrush.   RISK FACTORS  Thrush is more likely to develop in the following people:  · People with an impaired ability to fight infection (weakened immune system).    · Older adults.    · People with HIV.    · People with diabetes.    · People with dry mouth (xerostomia).    · Pregnant women.    · People with poor dental care, especially those who have false teeth.    · People who use antibiotic medicines.    SIGNS AND SYMPTOMS   Thrush can be a mild infection that causes no symptoms. If symptoms develop, they may include:   · A burning feeling in the mouth and throat. This can occur at the start of a thrush infection.    · White patches that adhere to the mouth and tongue. The tissue around the patches may be red, raw, and painful. If rubbed (during tooth brushing, for example), the patches and the tissue of the mouth may bleed easily.    · A bad taste in the mouth or difficulty tasting foods.     · Cottony feeling in the mouth.    · Pain during eating and swallowing.  DIAGNOSIS   Your health care provider can usually diagnose thrush by looking in your mouth and asking you questions about your health.   TREATMENT   Medicines that help prevent the growth of fungi (antifungals) are the standard treatment for thrush. These medicines are either applied directly to the affected area (topical) or swallowed (oral). The treatment will depend on the severity of the condition.   Mild Thrush  Mild cases of thrush may clear up with the use of an antifungal mouth rinse or lozenges. Treatment usually lasts about 14 days.   Moderate to Severe Thrush  · More severe thrush infections that have spread to the esophagus are treated with an oral antifungal medicine. A topical antifungal medicine may also be used.    · For some severe infections, a treatment period longer than 14 days may be needed.    · Oral antifungal medicines are almost never used during pregnancy because the fetus may be harmed. However, if a pregnant woman has a rare, severe thrush infection that has spread to her blood, oral antifungal medicines may be used. In this case, the risk of harm to the mother and fetus from the severe thrush infection may be greater than the risk posed by the use of antifungal medicines.    Persistent or Recurrent Thrush  For cases of   thrush that do not go away or keep coming back, treatment may involve the following:   · Treatment may be needed twice as long as the symptoms last.    · Treatment will include both oral and topical antifungal medicines.    · People with weakened immune systems can take an antifungal medicine on a continuous basis to prevent thrush infections.    It is important to treat conditions that make you more likely to get thrush, such as diabetes or HIV.   HOME CARE INSTRUCTIONS   · Only take over-the-counter or prescription medicine as directed by your health care provider. Talk to your health care  provider about an over-the-counter medicine called gentian violet, which kills bacteria and fungi.    · Eat plain, unflavored yogurt as directed by your health care provider. Check the label to make sure the yogurt contains live cultures. This yogurt can help healthy bacteria grow in the mouth that can stop the growth of the fungus that causes thrush.    · Try these measures to help reduce the discomfort of thrush:      Drink cold liquids such as water or iced tea.      Try flavored ice treats or frozen juices.      Eat foods that are easy to swallow, such as gelatin, ice cream, or custard.      If the patches in your mouth are painful, try drinking from a straw.    · Rinse your mouth several times a day with a warm saltwater rinse. You can make the saltwater mixture with 1 tsp (6 g) of salt in 8 fl oz (0.2 L) of warm water.    · If you wear dentures, remove the dentures before going to bed, brush them vigorously, and soak them in a cleaning solution as directed by your health care provider.    · Women who are breastfeeding should clean their nipples with an antifungal medicine as directed by their health care provider. Dry the nipples after breastfeeding. Applying lanolin-containing body lotion may help relieve nipple soreness.    SEEK MEDICAL CARE IF:  · Your symptoms are getting worse or are not improving within 7 days of starting treatment.    · You have symptoms of spreading infection, such as white patches on the skin outside of the mouth.    · You are nursing and you have redness, burning, or pain in the nipples that is not relieved with treatment.    MAKE SURE YOU:  · Understand these instructions.  · Will watch your condition.  · Will get help right away if you are not doing well or get worse.     This information is not intended to replace advice given to you by your health care provider. Make sure you discuss any questions you have with your health care provider.     Document Released: 11/14/2003 Document  Revised: 03/11/2014 Document Reviewed: 09/21/2012  Elsevier Interactive Patient Education ©2016 [employer]

## 2014-12-10 NOTE — Progress Notes (Signed)
Subjective:  Patient ID: Eric Mcdonald, male    DOB: [birth year]  Age: 49 y.o. MRN: [id number]  CC: Sore Throat; Shoulder Pain; and Hand Pain   HPI [name] presents  with a confusing history. He was treated on September 27 for strep with clindamycin 300 mg 3 times a day. He was previously hospitalized with polyarthralgia rheumatica twice and put on prednisone arm is mostly recent admission. He was discharge is been on a tapering dose of prednisone now taking 20 mg a day had symptom breakthrough at 20 mg and he has an appointment with rheumatologist on Wednesday is asked for an increased dose of 20 twice day until he sees his rheumatologist on Wednesday. Bitterly of sore throat and pain in his shoulders and legs. Has a hard time walking. Has no fever or chills. No rash. No cough or coryza.  History Lachlan has no past medical history on file.   He has past surgical history that includes Cholecystectomy (10/16/2008).   His  family history includes COPD in his father; Cancer in his maternal grandfather and paternal grandfather; Diabetes in his maternal grandmother.  He   reports that he has been smoking Cigarettes, Pipe, and Cigars.  He has been smoking about 0.50 packs per day. He has never used smokeless tobacco. He reports that he does not drink alcohol or use illicit drugs.  Outpatient Prescriptions Prior to Visit  Medication Sig Dispense Refill  . pantoprazole (PROTONIX) 40 MG tablet Take 1 tablet (40 mg total) by mouth daily at 12 noon. 30 tablet 1  . ranitidine (ZANTAC) 150 MG tablet Take 150 mg by mouth 2 (two) times daily.    . sildenafil (VIAGRA) 100 MG tablet Take 1 tablet (100 mg total) by mouth daily as needed for erectile dysfunction. 10 tablet 11  . predniSONE (DELTASONE) 20 MG tablet Take 1 tablet (20 mg total) by mouth 3 (three) times daily. 25 tablet 0  . ibuprofen (ADVIL,MOTRIN) 200 MG tablet Take 200-600 mg by mouth every 6 (six) hours as needed for headache or  moderate pain.    . magnesium hydroxide (MILK OF MAGNESIA) 400 MG/5ML suspension Take 40 mLs by mouth daily as needed for mild constipation.    Marland Kitchen oxyCODONE (OXY IR/ROXICODONE) 5 MG immediate release tablet Take 1 tablet (5 mg total) by mouth every 6 (six) hours as needed for severe pain. (Patient not taking: Reported on 12/10/2014) 30 tablet 0   No facility-administered medications prior to visit.    Social History   Social History  . Marital Status: Married    Spouse Name: N/A  . Number of Children: N/A  . Years of Education: N/A   Social History Main Topics  . Smoking status: Current Some Day Smoker -- 0.50 packs/day    Types: Cigarettes, Pipe, Cigars  . Smokeless tobacco: Never Used  . Alcohol Use: No  . Drug Use: No  . Sexual Activity: Not Currently   Other Topics Concern  . None   Social History Narrative     Review of Systems  Constitutional: Negative for fever, chills and appetite change.  HENT: Positive for sore throat. Negative for congestion, ear pain, postnasal drip and sinus pressure.   Eyes: Negative for pain and redness.  Respiratory: Negative for cough, shortness of breath and wheezing.   Cardiovascular: Negative for leg swelling.  Gastrointestinal: Negative for nausea, vomiting, abdominal pain, diarrhea, constipation and blood in stool.  Endocrine: Negative for polyuria.  Genitourinary: Negative for dysuria, urgency,  frequency and flank pain.  Musculoskeletal: Positive for myalgias and arthralgias. Negative for gait problem.  Skin: Negative for rash.  Neurological: Negative for weakness and headaches.  Psychiatric/Behavioral: Negative for confusion and decreased concentration. The patient is not nervous/anxious.     Objective:  BP 119/81 mmHg  Pulse 84  Temp(Src) 97.8 F (36.6 C) (Oral)  Resp 16  Ht 6' (1.829 m)  Wt 200 lb (90.719 kg)  BMI 27.12 kg/m2  SpO2 99%  Physical Exam  Constitutional: He is oriented to person, place, and time. He appears  well-developed and well-nourished.  HENT:  Head: Normocephalic and atraumatic.  He has a exudate on his soft palate consistent with thrush  Eyes: Conjunctivae are normal. Pupils are equal, round, and reactive to light.  Pulmonary/Chest: Effort normal.  Musculoskeletal: He exhibits no edema.  Neurological: He is alert and oriented to person, place, and time.  Skin: Skin is dry.  Psychiatric: He has a normal mood and affect. Judgment and thought content normal. He is agitated and aggressive. Cognition and memory are normal.  He is rather coarse and vulgar  in his discussions      Assessment & Plan:   Keaden was seen today for sore throat, shoulder pain and hand pain.  Diagnoses and all orders for this visit:  Polymyalgia rheumatica syndrome (HCC)  Sore throat  Thrush -     predniSONE (DELTASONE) 20 MG tablet; Take 1 tablet (20 mg total) by mouth 3 (three) times daily with meals.  Blood in stool -     Clostridium difficile EIA  Other orders -     fluconazole (DIFLUCAN) 100 MG tablet; 2 now and one daily  I have changed Mr. Jeffrey's predniSONE. I am also having him start on fluconazole. Additionally, I am having him maintain his sildenafil, ranitidine, magnesium hydroxide, ibuprofen, oxyCODONE, and pantoprazole.  Meds ordered this encounter  Medications  . predniSONE (DELTASONE) 20 MG tablet    Sig: Take 1 tablet (20 mg total) by mouth 3 (three) times daily with meals.    Dispense:  30 tablet    Refill:  0  . fluconazole (DIFLUCAN) 100 MG tablet    Sig: 2 now and one daily    Dispense:  20 tablet    Refill:  0    Appropriate red flag conditions were discussed with the patient as well as actions that should be taken.  Patient expressed his understanding.  Follow-up: Return if symptoms worsen or fail to improve.  Carmelina Dane, MD

## 2014-12-11 LAB — C. DIFFICILE GDH AND TOXIN A/B
C. DIFF TOXIN A/B: NOT DETECTED
C. DIFFICILE GDH: DETECTED — AB

## 2014-12-12 ENCOUNTER — Encounter: Payer: Self-pay | Admitting: Family Medicine

## 2014-12-12 LAB — CLOSTRIDIUM DIFFICILE BY PCR: Toxigenic C. Difficile by PCR: NOT DETECTED

## 2014-12-21 ENCOUNTER — Telehealth: Payer: Self-pay | Admitting: Family Medicine

## 2014-12-21 DIAGNOSIS — B370 Candidal stomatitis: Secondary | ICD-10-CM

## 2014-12-21 MED ORDER — PREDNISONE 20 MG PO TABS: ORAL_TABLET | ORAL | Status: DC

## 2014-12-21 NOTE — Telephone Encounter (Signed)
Pt is calling to request a prescription of prednisone. He uses the [facility]

## 2014-12-21 NOTE — Telephone Encounter (Signed)
Prescription sent to pharmacy.

## 2014-12-22 ENCOUNTER — Encounter: Payer: Self-pay | Admitting: *Deleted

## 2014-12-27 ENCOUNTER — Other Ambulatory Visit: Payer: Self-pay | Admitting: Family Medicine

## 2014-12-27 DIAGNOSIS — R1011 Right upper quadrant pain: Secondary | ICD-10-CM

## 2014-12-27 DIAGNOSIS — T50905D Adverse effect of unspecified drugs, medicaments and biological substances, subsequent encounter: Secondary | ICD-10-CM

## 2014-12-27 NOTE — Progress Notes (Signed)
Pt came by office on Friday 10/21, he was concerned his polyarthralgia was not due to some autoimmune disease but a systemic reaction from the lamisil he was given for nail fungus. He initially started with RUQ pain, though LFT, lipase CT were neg, he never had any hepatits symptoms but proceeded with generalized joint swelling/pain which responded to prednisone. I reviewed rheumatology note, concern viral etiology EBV may be cause, try to taper him off steroids.   He is quite adament it is the lamisil and wants a second opinion from hepatology despite this not being the typical reaction for this med. Will proceed with referral. Discussed that they likley can not blame this medication with no other factors pointing to hepatitis, he would still like to discuss with them.

## 2015-01-23 ENCOUNTER — Telehealth: Payer: Self-pay | Admitting: Family Medicine

## 2015-01-23 NOTE — Telephone Encounter (Signed)
Have we received the medication?

## 2015-01-23 NOTE — Telephone Encounter (Signed)
I could not find the medication. It was not in the pick up cabinet or sample closet.   Florentina Addisonkatie

## 2015-01-23 NOTE — Telephone Encounter (Signed)
Pt called stating that a drug company sends him a free prescription for Viagra once a month that he picks it up here. Please advise pt on how he can receive this.  PH: [phone]

## 2015-01-24 NOTE — Telephone Encounter (Signed)
I need more info.   Has the company sent prescription to us before? If so, how long did that take?  Usually once we get the prescription, we call patient and place a msg in chart. I do not see any previous message.   Call placed to patient to inquire. LMTRC.

## 2015-01-25 NOTE — Telephone Encounter (Signed)
Patient returned call and advised to contact [employer] to see if medication has been sent.

## 2015-02-22 ENCOUNTER — Telehealth: Payer: Self-pay | Admitting: *Deleted

## 2015-02-22 NOTE — Telephone Encounter (Signed)
Pt called stating he called last month to get his viagra filled and has not heard anything from it and wants to know if can get refill sent to his pharmacy [facility] to get free medication(?)  Pt ID: [id number]  Pharmacy number [phone]  Please call pt once this has been taking care of.  Pt wants to know if he can have the script sent to his home address instead of our office.

## 2015-02-22 NOTE — Telephone Encounter (Signed)
Prescription ordered.   Expected 7-10 business days to arrive.   Call placed to patient and patient made aware per VM.   Also advised that [facility] will only send meds to [facility] office.

## 2015-02-28 NOTE — Telephone Encounter (Signed)
Received medication from mail.   Call placed to patient and patient made aware per VM to pick up at front desk.

## 2015-03-01 ENCOUNTER — Ambulatory Visit: Payer: BC Managed Care – PPO | Admitting: Family Medicine

## 2015-03-02 ENCOUNTER — Encounter: Payer: Self-pay | Admitting: Family Medicine

## 2015-03-02 ENCOUNTER — Other Ambulatory Visit: Payer: Self-pay | Admitting: Family Medicine

## 2015-03-02 ENCOUNTER — Ambulatory Visit (INDEPENDENT_AMBULATORY_CARE_PROVIDER_SITE_OTHER): Payer: BC Managed Care – PPO | Admitting: Family Medicine

## 2015-03-02 VITALS — BP 132/78 | HR 74 | Temp 97.8°F | Resp 18 | Ht 72.0 in | Wt 211.0 lb

## 2015-03-02 DIAGNOSIS — N528 Other male erectile dysfunction: Secondary | ICD-10-CM

## 2015-03-02 DIAGNOSIS — R7989 Other specified abnormal findings of blood chemistry: Secondary | ICD-10-CM

## 2015-03-02 DIAGNOSIS — R5383 Other fatigue: Secondary | ICD-10-CM

## 2015-03-02 DIAGNOSIS — E291 Testicular hypofunction: Secondary | ICD-10-CM | POA: Diagnosis not present

## 2015-03-02 LAB — COMPREHENSIVE METABOLIC PANEL
ALK PHOS: 53 U/L (ref 40–115)
ALT: 22 U/L (ref 9–46)
AST: 18 U/L (ref 10–35)
Albumin: 4.3 g/dL (ref 3.6–5.1)
BUN: 15 mg/dL (ref 7–25)
CO2: 22 mmol/L (ref 20–31)
Calcium: 9.2 mg/dL (ref 8.6–10.3)
Chloride: 103 mmol/L (ref 98–110)
Creat: 1.03 mg/dL (ref 0.70–1.33)
GLUCOSE: 123 mg/dL — AB (ref 70–99)
POTASSIUM: 4.1 mmol/L (ref 3.5–5.3)
Sodium: 141 mmol/L (ref 135–146)
Total Bilirubin: 0.7 mg/dL (ref 0.2–1.2)
Total Protein: 6.7 g/dL (ref 6.1–8.1)

## 2015-03-02 LAB — CBC WITH DIFFERENTIAL/PLATELET
BASOS ABS: 0 10*3/uL (ref 0.0–0.1)
BASOS PCT: 0 % (ref 0–1)
EOS ABS: 0.1 10*3/uL (ref 0.0–0.7)
EOS PCT: 1 % (ref 0–5)
HEMATOCRIT: 46.3 % (ref 39.0–52.0)
Hemoglobin: 16.2 g/dL (ref 13.0–17.0)
Lymphocytes Relative: 21 % (ref 12–46)
Lymphs Abs: 2.2 10*3/uL (ref 0.7–4.0)
MCH: 30.6 pg (ref 26.0–34.0)
MCHC: 35 g/dL (ref 30.0–36.0)
MCV: 87.5 fL (ref 78.0–100.0)
MONO ABS: 0.6 10*3/uL (ref 0.1–1.0)
MPV: 8.7 fL (ref 8.6–12.4)
Monocytes Relative: 6 % (ref 3–12)
Neutro Abs: 7.4 10*3/uL (ref 1.7–7.7)
Neutrophils Relative %: 72 % (ref 43–77)
PLATELETS: 248 10*3/uL (ref 150–400)
RBC: 5.29 MIL/uL (ref 4.22–5.81)
RDW: 15 % (ref 11.5–15.5)
WBC: 10.3 10*3/uL (ref 4.0–10.5)

## 2015-03-02 LAB — TSH: TSH: 1.957 u[IU]/mL (ref 0.350–4.500)

## 2015-03-02 NOTE — Assessment & Plan Note (Addendum)
Recheck testosterone levels. He will continue the Viagra. Will add PSA to labs, pending results, restart testosterone supplement

## 2015-03-02 NOTE — Patient Instructions (Signed)
We will call with lab results Continue current medications F/U 6 months for PHYSICAL

## 2015-03-02 NOTE — Progress Notes (Signed)
Patient ID: Eric Mcdonald, male   DOB: 1965/06/28, 49 y.o.   MRN: [id number]   Subjective:    Patient ID: Eric Dorik S Cooler, male    DOB: 1965/06/28, 49 y.o.   MRN: [id number]  Patient presents for Medication Review  Pt here to f/u medications. He would like to go back on his testosterone gel. He has noticed increased fatigue low libido. Though he states that he is not in a relationship at this time. He has been getting Viagra but states that he she's holding onto it.?? He has some type of polymyalgia rheumatica syndrome back in the fall this is now resolved she's been off of prednisone for the past 2 months. He states that he feels well with regards to muscle aches and pains he no longer has any of that. He now feels like his typical symptoms when he was initially diagnosed with low testosterone. He went off of the testosterone supplement in the past because he could not afford it.    Review Of Systems:  GEN- + fatigue, fever, weight loss,weakness, recent illness HEENT- denies eye drainage, change in vision, nasal discharge, CVS- denies chest pain, palpitations RESP- denies SOB, cough, wheeze ABD- denies N/V, change in stools, abd pain GU- denies dysuria, hematuria, dribbling, incontinence MSK- denies joint pain, muscle aches, injury Neuro- denies headache, dizziness, syncope, seizure activity       Objective:    BP 132/78 mmHg  Pulse 74  Temp(Src) 97.8 F (36.6 C) (Oral)  Resp 18  Ht 6' (1.829 m)  Wt 211 lb (95.709 kg)  BMI 28.61 kg/m2 GEN- NAD, alert and oriented x3,weight gain  HEENT- PERRL, EOMI, non injected sclera, pink conjunctiva, MMM, oropharynx clear CVS- RRR, no murmur RESP-CTAB ABD-NABS,soft,NT,ND, no HSM EXT- No edema Pulses- Radial  2+        Assessment & Plan:      Problem List Items Addressed This Visit    None      Note: This dictation was prepared with Dragon dictation along with smaller phrase technology. Any transcriptional errors that result  from this process are unintentional.

## 2015-03-03 ENCOUNTER — Encounter: Payer: Self-pay | Admitting: Family Medicine

## 2015-03-03 LAB — TESTOSTERONE: Testosterone: 237 ng/dL — ABNORMAL LOW (ref 300–890)

## 2015-03-04 LAB — PSA: PSA: 0.93 ng/mL (ref <=4.00)

## 2015-03-07 MED ORDER — TESTOSTERONE 20.25 MG/ACT (1.62%) TD GEL: TRANSDERMAL | Status: DC

## 2015-03-07 NOTE — Addendum Note (Signed)
Addended by: Milinda AntisURHAM, Shawanna Zanders F on: 03/07/2015 10:08 AM   Modules accepted: Orders

## 2015-03-09 ENCOUNTER — Telehealth: Payer: Self-pay | Admitting: *Deleted

## 2015-03-09 NOTE — Telephone Encounter (Signed)
Received fax from pharmacy.   Reports that Androgel is not on patient formulary.   Reports that Axiron is preferred.   MD please advise.

## 2015-03-10 ENCOUNTER — Encounter: Payer: Self-pay | Admitting: *Deleted

## 2015-03-10 MED ORDER — TESTOSTERONE 30 MG/ACT TD SOLN: TRANSDERMAL | Status: DC

## 2015-03-10 NOTE — Telephone Encounter (Signed)
Change to Axiron 30mg  -same dose as previous

## 2015-03-10 NOTE — Telephone Encounter (Signed)
Prescription faxed

## 2015-03-13 ENCOUNTER — Telehealth: Payer: Self-pay | Admitting: Family Medicine

## 2015-03-13 MED ORDER — TESTOSTERONE 4 MG/24HR TD PT24: 1.0000 | MEDICATED_PATCH | Freq: Every day | TRANSDERMAL | Status: DC

## 2015-03-13 NOTE — Telephone Encounter (Signed)
Rx for Androderm patches faxed to pharmacy and pt made aware

## 2015-03-13 NOTE — Telephone Encounter (Signed)
Send in Androderm 4mg  patch daily

## 2015-03-13 NOTE — Telephone Encounter (Signed)
Pt says the Axiron is $600.  He can not afford that.  Pharmacy/Insurance recommended Androderm?  Please advise.

## 2015-03-14 ENCOUNTER — Telehealth: Payer: Self-pay | Admitting: Family Medicine

## 2015-03-14 NOTE — Telephone Encounter (Signed)
PA requested for Androderm testosterone patches.  [id number].  Rec'd approval.

## 2015-03-15 NOTE — Telephone Encounter (Signed)
Approval faxed to pharmacy

## 2015-04-24 ENCOUNTER — Other Ambulatory Visit: Payer: Self-pay | Admitting: *Deleted

## 2015-04-24 NOTE — Telephone Encounter (Signed)
Received fax requesting refill on Adrogel.  Advised pharmacy to D/C Androgel- med has been changed to Androderm Patches.

## 2015-05-24 ENCOUNTER — Telehealth: Payer: Self-pay | Admitting: *Deleted

## 2015-05-24 NOTE — Telephone Encounter (Signed)
Received call from patient.   Reports that he requires refill on [facility] from [facility]. [phone]  Patientt ID: [id number].  Medication re-ordered. Should arrive in 7- 10 business days.

## 2015-06-22 NOTE — Telephone Encounter (Signed)
Medication has arrived and patient has picked up prescription.

## 2015-08-18 ENCOUNTER — Telehealth: Payer: Self-pay | Admitting: Family Medicine

## 2015-08-18 NOTE — Telephone Encounter (Signed)
Received call from patient.   Reports that he requires refill on Viagra from [facility]. [phone]  Patientt ID: [id number].  CB# [phone]

## 2015-08-21 NOTE — Telephone Encounter (Signed)
Call placed to [facility].   Medication reordered.   Order number [id number]. Allow 7-10 business days to deliver to office.

## 2015-08-24 NOTE — Telephone Encounter (Signed)
Medication arrived and patient made aware to come to office to pick up.

## 2015-11-09 ENCOUNTER — Telehealth: Payer: Self-pay | Admitting: *Deleted

## 2015-11-09 NOTE — Telephone Encounter (Signed)
Received call from patient.   Reports that he will need refill of Viagra from prescription assistance with [employer].   Patient enrollment expired on 10/31/2015.  New forms placed at front desk for patient to complete.   Call placed to patient and patient made aware per VM.

## 2016-09-21 IMAGING — CT CT ABD-PELV W/ CM
2 of 5 series · 10 of 46 positions shown, 11 images · IV contrast (Iodine)
Comparison: None.

CLINICAL DATA: Low back pain. Abdominal pain starting [REDACTED].
Right-sided abdominal pain.

EXAM:
CT ABDOMEN AND PELVIS WITH CONTRAST
TECHNIQUE: Multidetector CT imaging of the abdomen and pelvis was performed
using the standard protocol following bolus administration of
intravenous contrast.
CONTRAST:  100mL OMNIPAQUE IOHEXOL 300 MG/ML  SOLN

[Series 201: routine, idose (2) · axial · 0.78mm/px · z∈[+63,+473]mm · 7 of 104 slices shown, 8 images]
[im 11/104  soft-tissue]
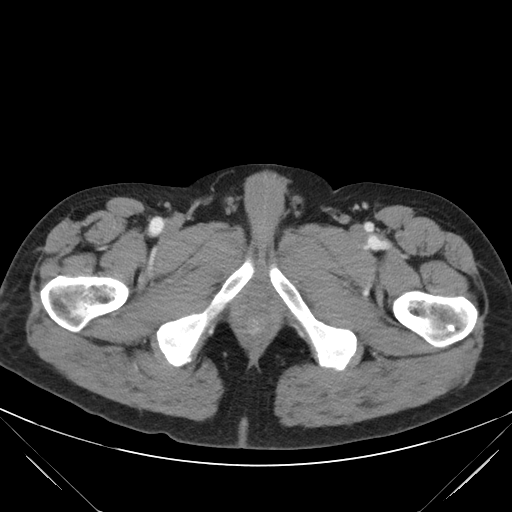
[im 11/104  bone]
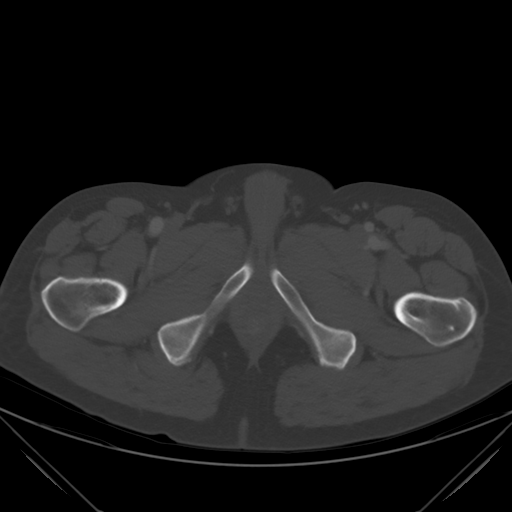
[im 22/104  soft-tissue]
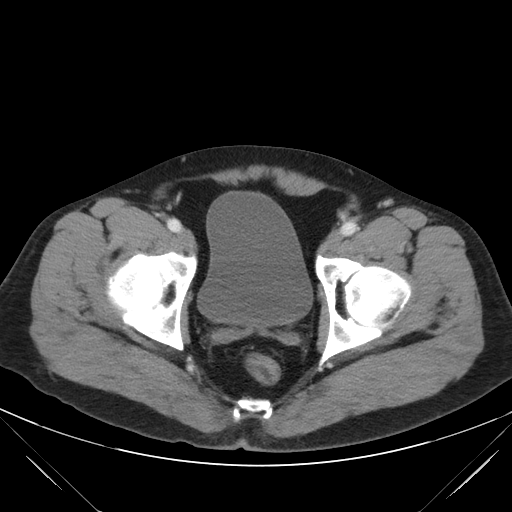
[im 38/104  soft-tissue]
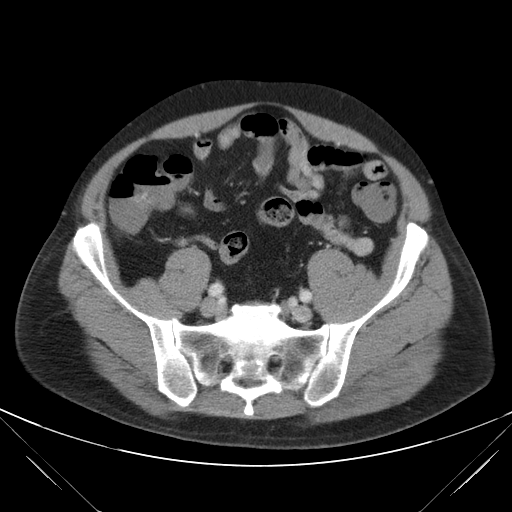
[im 55/104  soft-tissue]
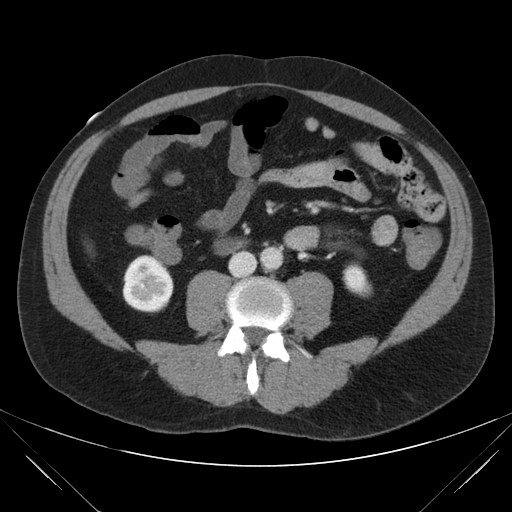
[im 66/104  soft-tissue]
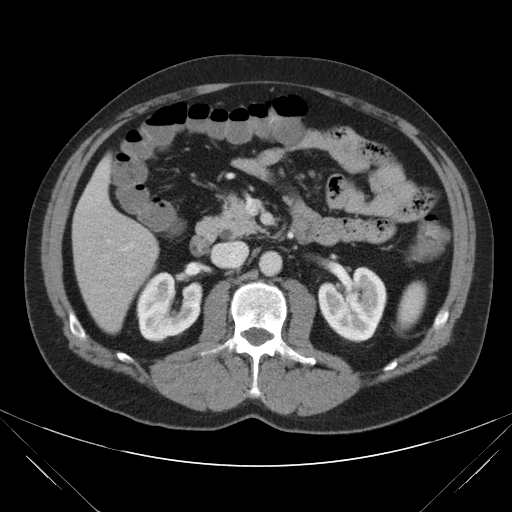
[im 82/104  soft-tissue]
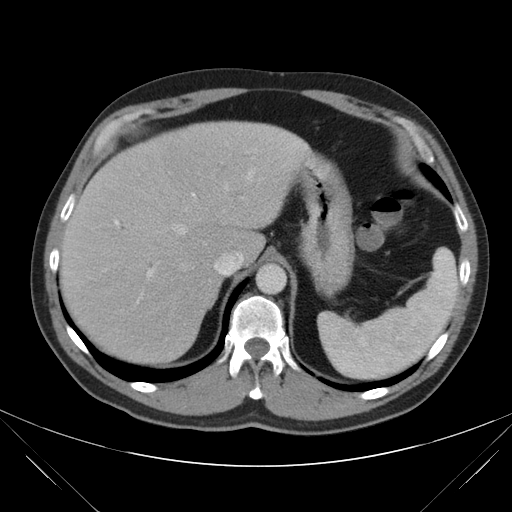
[im 93/104  soft-tissue]
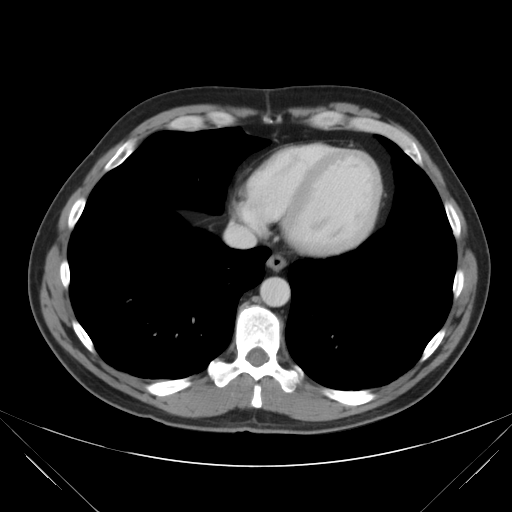

[Series 203: coronals, idose (2) · coronal · 0.45mm/px · 3 of 132 slices shown]
[im 44/132  soft-tissue]
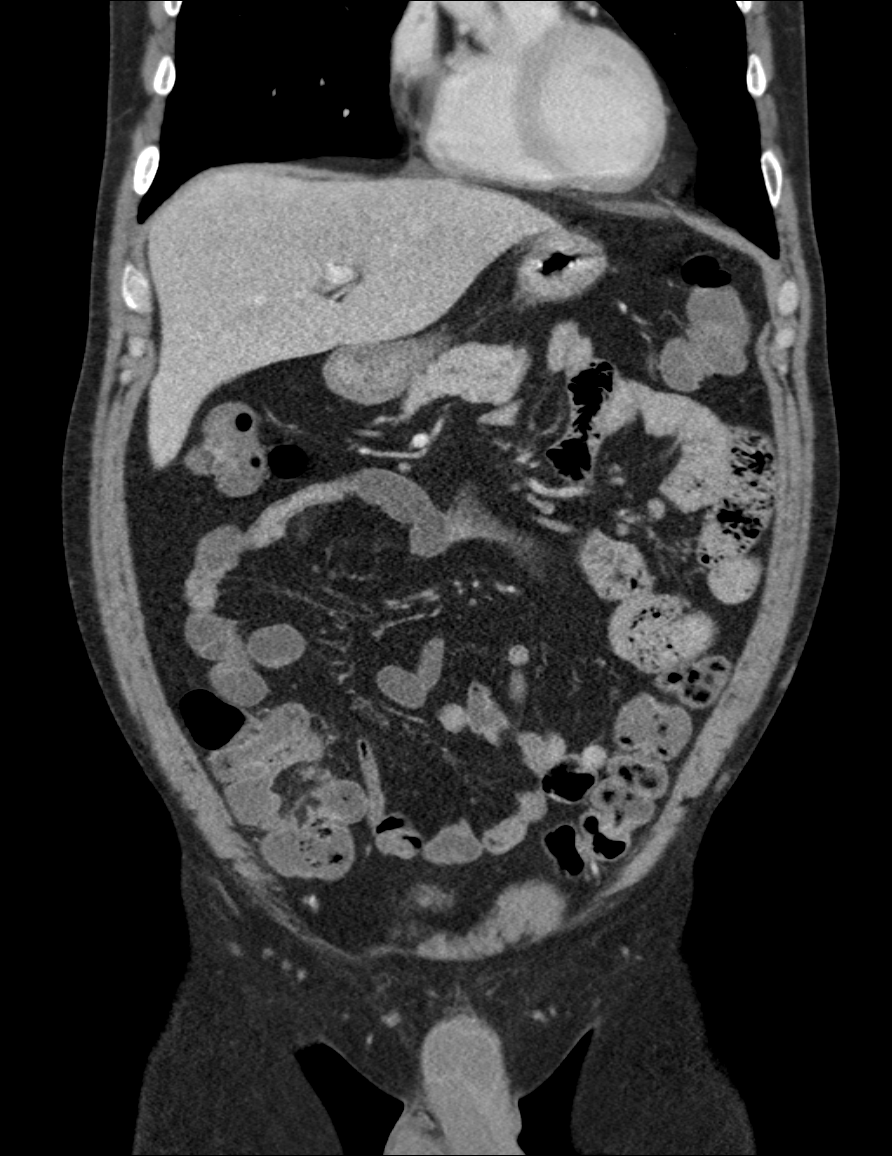
[im 59/132  soft-tissue]
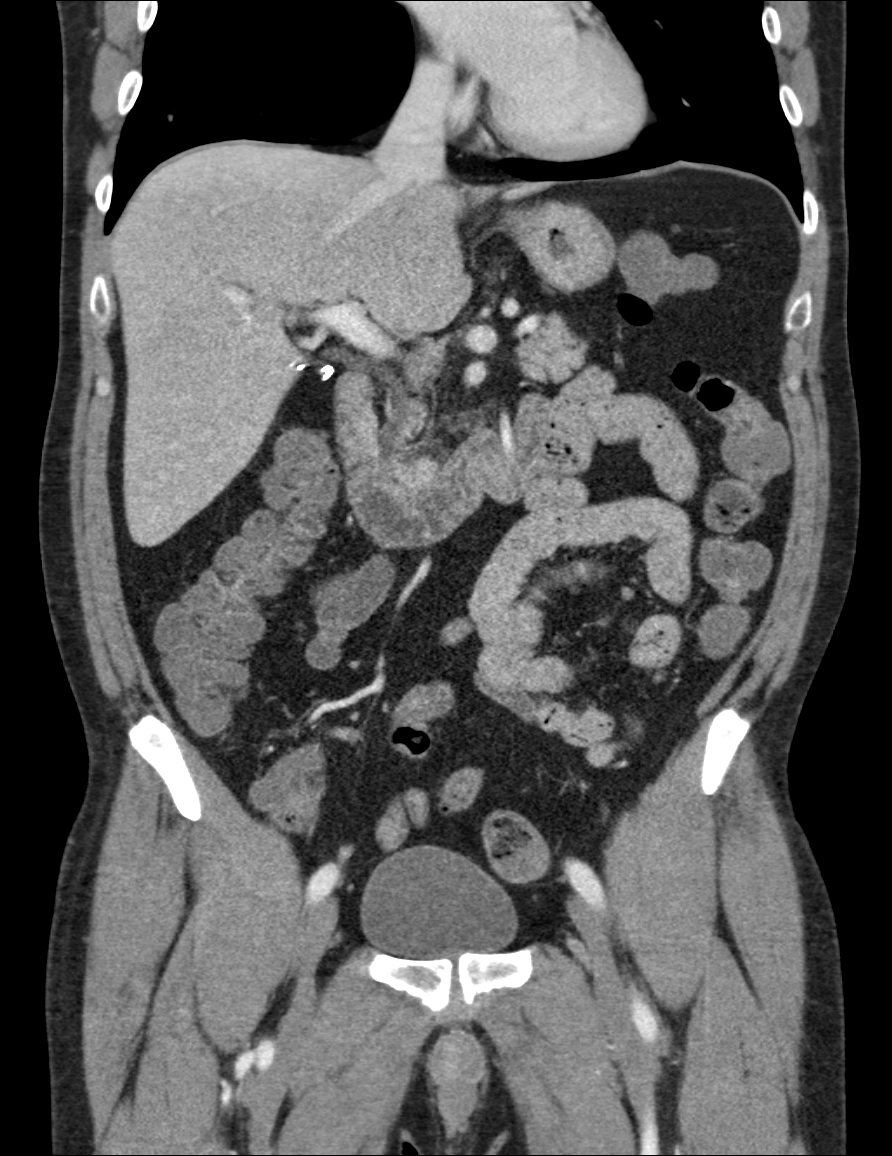
[im 73/132  soft-tissue]
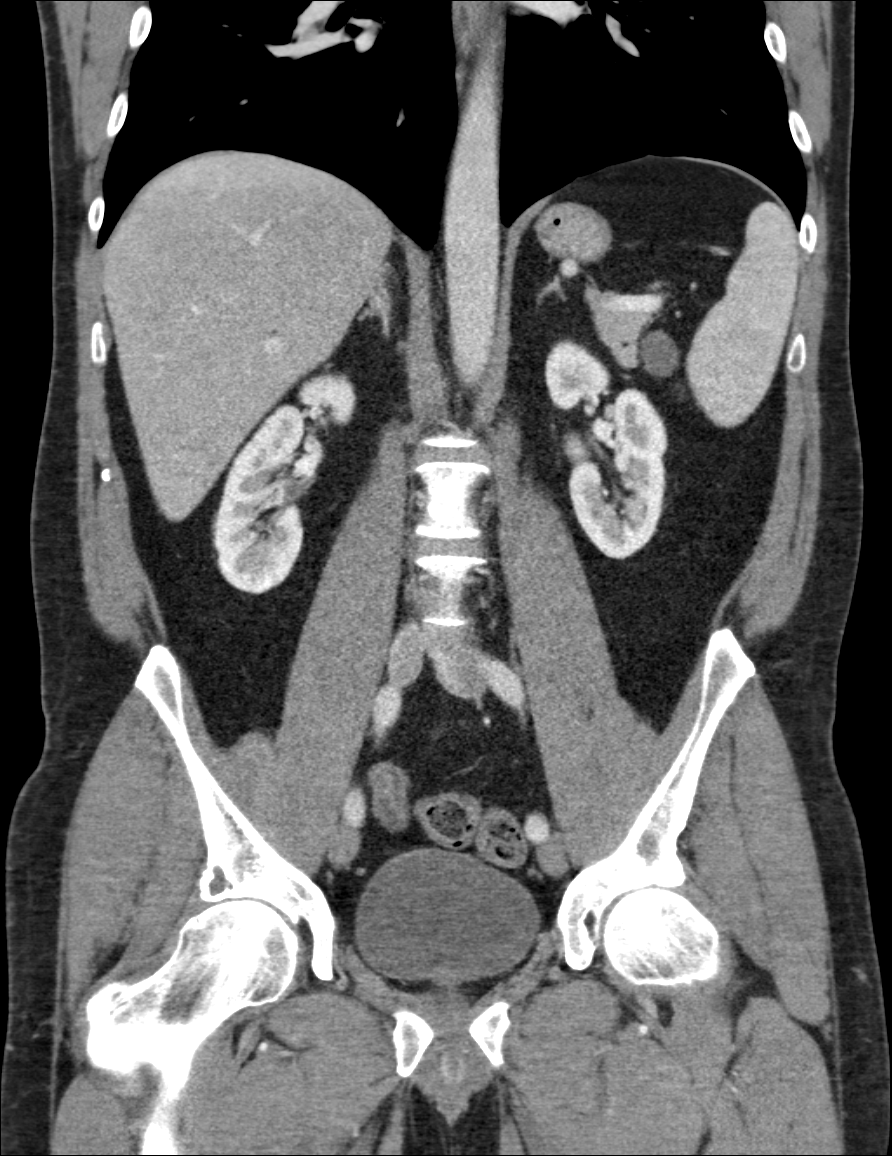

[10 of 46 positions shown; findings below may reference images not displayed]

FINDINGS: Emphysematous changes and slight scarring in the lung bases.
Noncalcified nodule in the right lung base measuring 5.5 mm. If the
patient is at high risk for bronchogenic carcinoma, follow-up chest
CT at 6-12 months is recommended. If the patient is at low risk for
bronchogenic carcinoma, follow-up chest CT at 12 months is
recommended. This recommendation follows the consensus statement:
Guidelines for Management of Small Pulmonary Nodules Detected on CT
Scans: A Statement from the [HOSPITAL] as published in

Surgical absence of the gallbladder. No bile duct dilatation. Mild
diffuse fatty infiltration of the liver. No focal liver lesions. The
pancreas, spleen, adrenal glands, kidneys, abdominal aorta, inferior
vena cava, and retroperitoneal lymph nodes are unremarkable.
Stomach, small bowel, and colon are not abnormally distended. Liquid
stool throughout the colon may be associated with diarrhea. No
colonic wall thickening is appreciated. No free air or free fluid in
the abdomen.

Pelvis: The appendix is normal. Prostate gland is not significantly
enlarged. Prostate calcifications. Scattered diverticula in the
sigmoid colon. No evidence of diverticulitis. Bladder wall is not
thickened. No pelvic mass or lymphadenopathy. No free or loculated
pelvic fluid collections. Degenerative changes in the spine and
hips. No destructive bone lesions.
IMPRESSION: Mild diffuse fatty infiltration of the liver. 5.5 mm nodule in the
right lung base. See above followup recommendations. Liquid stool in
the colon. No acute process otherwise identified.

## 2016-09-23 IMAGING — CR DG CHEST 2V
2 series · 2 of 2 positions shown · non-contrast
Comparison: Chest x-rays dated 11/20/2014 and 12/27/2013

CLINICAL DATA: Bilateral arm pain.

EXAM:
CHEST  2 VIEW

[w chest lat]
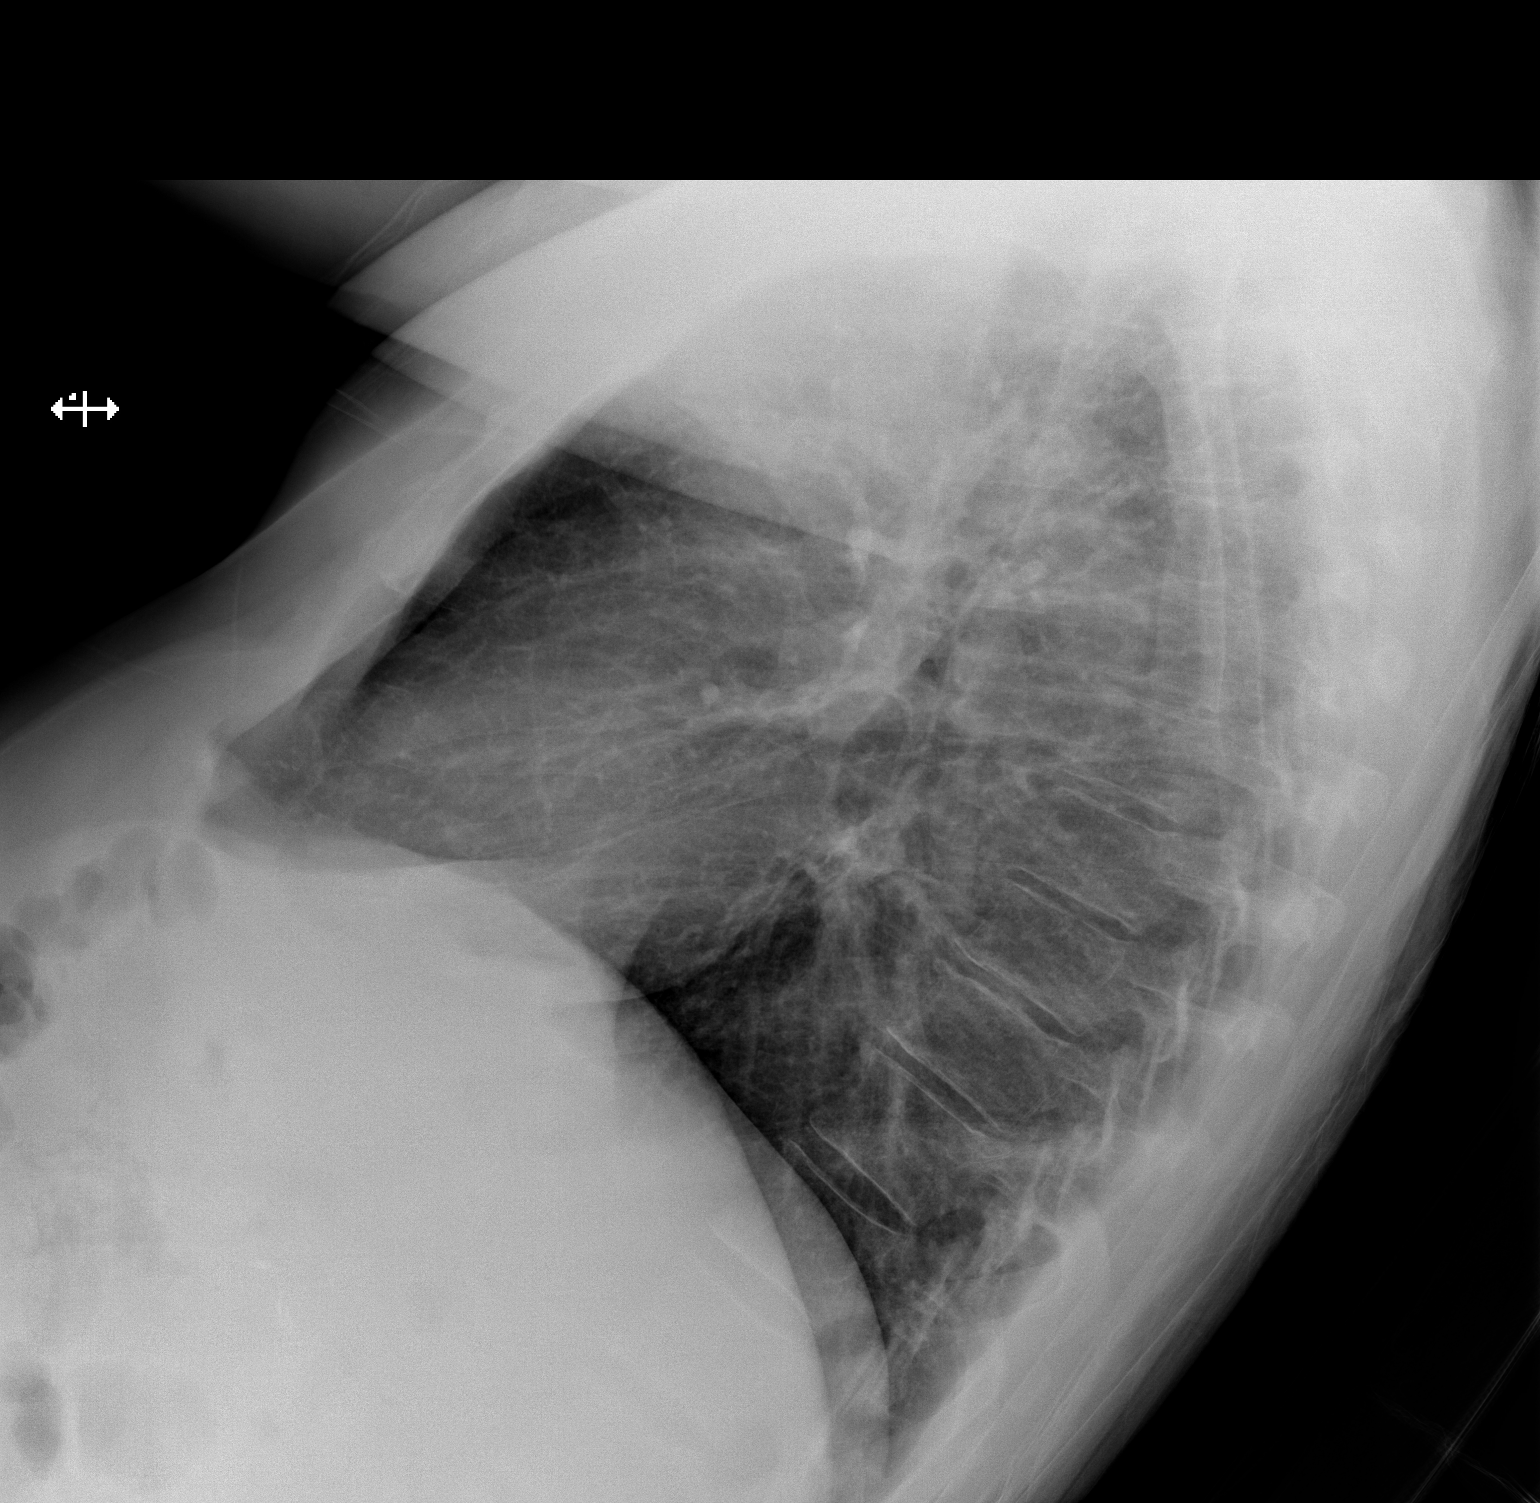

[x chest ap]
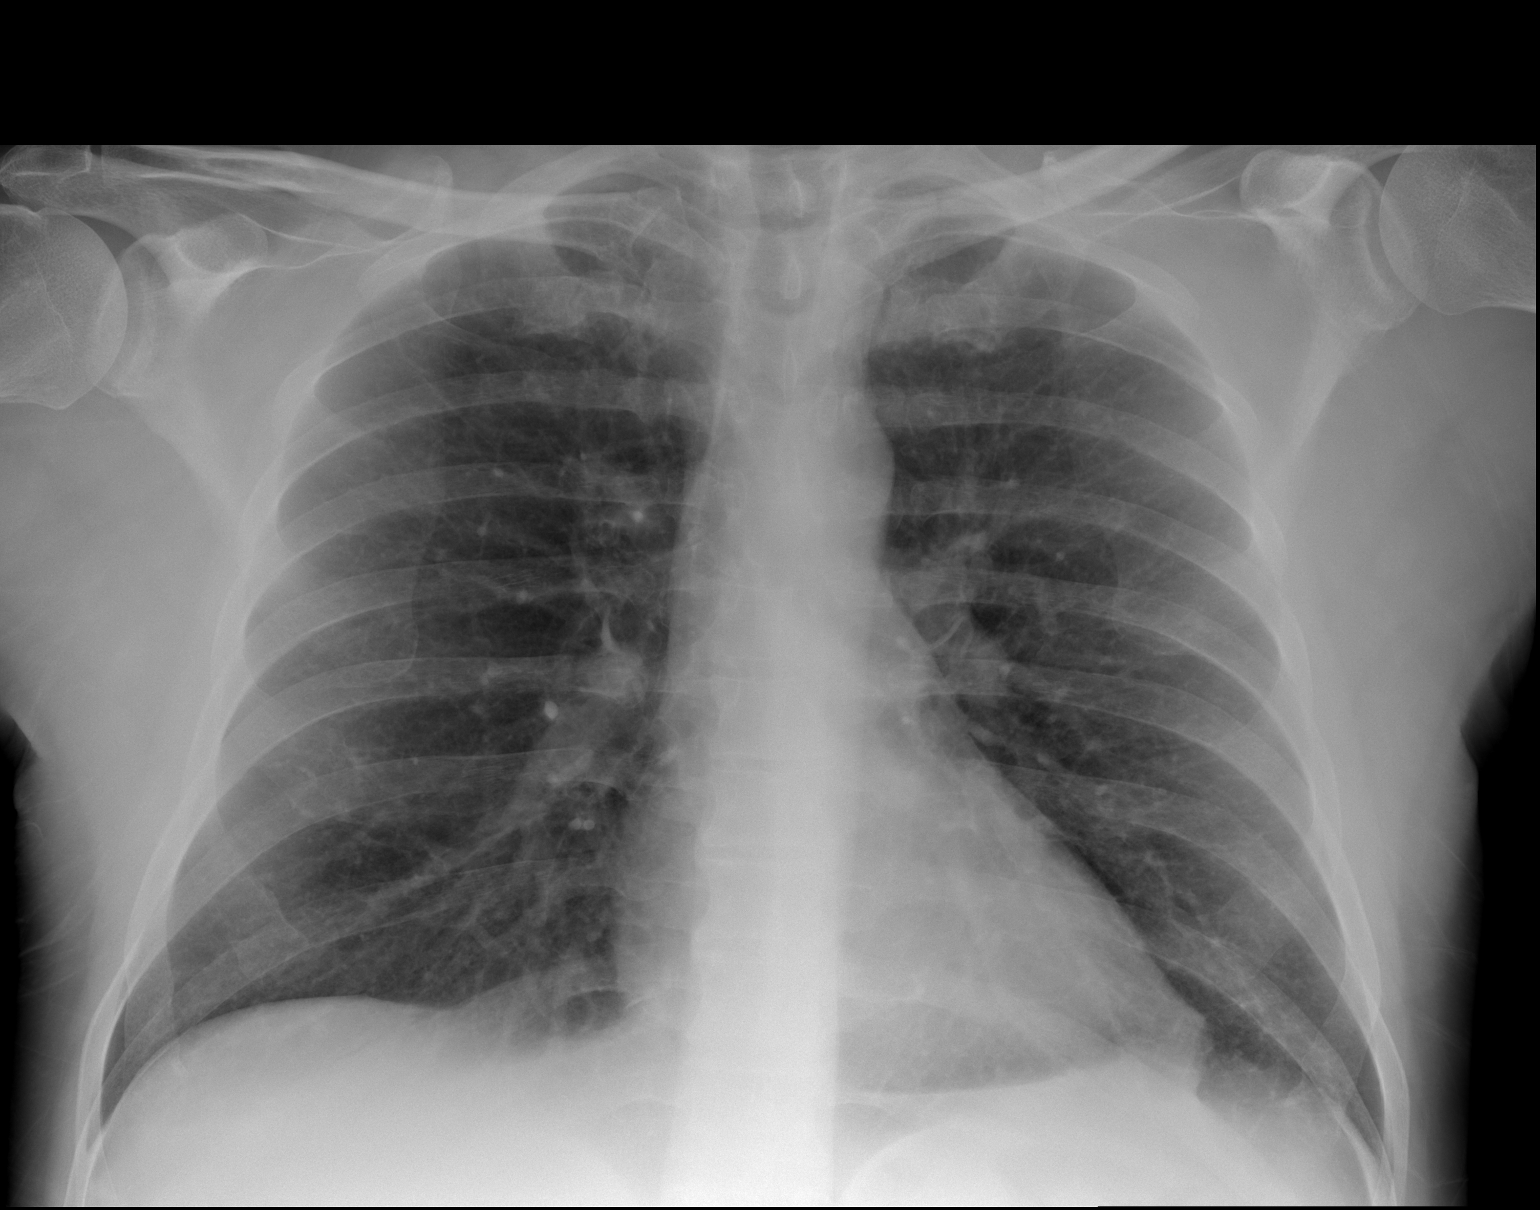

[2 of 2 positions shown; findings below may reference images not displayed]

FINDINGS: The heart size and mediastinal contours are within normal limits.
Both lungs are clear. The visualized skeletal structures are
unremarkable.
IMPRESSION: Normal exam.

## 2016-11-01 ENCOUNTER — Ambulatory Visit: Payer: Self-pay | Admitting: Family Medicine

## 2016-11-05 ENCOUNTER — Encounter: Payer: Self-pay | Admitting: Family Medicine

## 2017-10-10 ENCOUNTER — Encounter: Payer: Self-pay | Admitting: Family Medicine

## 2017-10-10 ENCOUNTER — Ambulatory Visit (HOSPITAL_COMMUNITY)
Admission: EM | Admit: 2017-10-10 | Discharge: 2017-10-10 | Discharge status: Absent without leave | Payer: BC Managed Care – PPO

## 2017-10-10 ENCOUNTER — Ambulatory Visit: Payer: BC Managed Care – PPO | Admitting: Family Medicine

## 2017-10-10 VITALS — BP 124/88 | HR 84 | Temp 97.7°F | Resp 16 | Ht 72.0 in | Wt 203.2 lb

## 2017-10-10 DIAGNOSIS — J310 Chronic rhinitis: Secondary | ICD-10-CM

## 2017-10-10 DIAGNOSIS — H109 Unspecified conjunctivitis: Secondary | ICD-10-CM | POA: Diagnosis not present

## 2017-10-10 MED ORDER — POLYMYXIN B-TRIMETHOPRIM 10000-0.1 UNIT/ML-% OP SOLN: 1.0000 [drp] | OPHTHALMIC | 0 refills | Status: AC

## 2017-10-10 MED ORDER — CROMOLYN SODIUM 4 % OP SOLN: 1.0000 [drp] | Freq: Four times a day (QID) | OPHTHALMIC | 0 refills | Status: AC

## 2017-10-10 NOTE — Patient Instructions (Addendum)
I suspect your right eye symptoms are from allergies and/or a virus.    Please start using your allergy medications at home, because they will help with some symptoms of whether or not it is viral or allergic.  The treatment for both viral and allergic conjunctivitis is with antihistamine and decongestant drops - which are available over the counter.  So you can continue using your over the counter eye drops and start using the cromolyn drops as prescribed.  Cromolyn is a additional allergy medicine that if it significantly helps with swelling, redness and tearing - it is highly likely to be allergies in your eye.    HOLD THE POLYTRIM EYE DROPS  Only fill and start taking the antibiotic eye drops if you develop persistent cloudy thick (purulent) discharge from your eye.  If this occurs you need to be rechecked immediately if you have swelling and redness develop around eye, severe pain, decreased vision.    Bacterial conjunctivitis   Viral Conjunctivitis, Adult Viral conjunctivitis is an inflammation of the clear membrane that covers the white part of your eye and the inner surface of your eyelid (conjunctiva). The inflammation is caused by a viral infection. The blood vessels in the conjunctiva become inflamed, causing the eye to become red or pink, and often itchy. Viral conjunctivitis can be easily passed from one person to another (is contagious). This condition is often called pink eye. What are the causes? This condition is caused by a virus. A virus is a type of contagious germ. It can be spread by touching objects that have been contaminated with the virus, such as doorknobs or towels. It can also be passed through droplets, such as from coughing or sneezing. What are the signs or symptoms? Symptoms of this condition include:  Eye redness.  Tearing or watery eyes.  Itchy and irritated eyes.  Burning feeling in the eyes.  Clear drainage from the eye.  Swollen eyelids.  A  gritty feeling in the eye.  Light sensitivity.  This condition often occurs with other symptoms, such as a fever, nausea, or a rash. How is this diagnosed? This condition is diagnosed with a medical history and physical exam. If you have discharge from your eye, the discharge may be tested to rule out other causes of conjunctivitis. How is this treated? Viral conjunctivitis does not respond to medicines that kill bacteria (antibiotics). Treatment for viral conjunctivitis is directed at stopping a bacterial infection from developing in addition to the viral infection. Treatment also aims to relieve your symptoms, such as itching. This may be done with antihistamine drops or other eye medicines. Rarely, steroid eye drops or antiviral medicines may be prescribed. Follow these instructions at home: Medicines   Take or apply over-the-counter and prescription medicines only as told by your health care provider.  Be very careful to avoid touching the edge of the eyelid with the eye drop bottle or ointment tube when applying medicines to the affected eye. Being careful this way will stop you from spreading the infection to the other eye or to other people. Eye care  Avoid touching or rubbing your eyes.  Apply a warm, wet, clean washcloth to your eye for 10-20 minutes, 3-4 times per day or as told by your health care provider.  If you wear contact lenses, do not wear them until the inflammation is gone and your health care provider says it is safe to wear them again. Ask your health care provider how to sterilize or replace  your contact lenses before using them again. Wear glasses until you can resume wearing contacts.  Avoid wearing eye makeup until the inflammation is gone. Throw away any old eye cosmetics that may be contaminated.  Gently wipe away any drainage from your eye with a warm, wet washcloth or a cotton ball. General instructions  Change or wash your pillowcase every day or as told  by your health care provider.  Do not share towels, pillowcases, washcloths, eye makeup, makeup brushes, contact lenses, or glasses. This may spread the infection.  Wash your hands often with soap and water. Use paper towels to dry your hands. If soap and water are not available, use hand sanitizer.  Try to avoid contact with other people for one week or as told by your health care provider. Contact a health care provider if:  Your symptoms do not improve with treatment or they get worse.  You have increased pain.  Your vision becomes blurry.  You have a fever.  You have facial pain, redness, or swelling.  You have yellow or green drainage coming from your eye.  You have new symptoms. This information is not intended to replace advice given to you by your health care provider. Make sure you discuss any questions you have with your health care provider. Document Released: 05/11/2002 Document Revised: 09/16/2015 Document Reviewed: 09/05/2015 Elsevier Interactive Patient Education  2018 [employer].   Allergic Conjunctivitis A clear membrane (conjunctiva) covers the white part of your eye and the inner surface of your eyelid. Allergic conjunctivitis happens when this membrane has inflammation. This is caused by allergies. Common causes of allergic reactions (allergens)include:  Outdoor allergens, such as: ? Pollen. ? Grass and weeds. ? Mold spores.  Indoor allergens, such as: ? Dust. ? Smoke. ? Mold. ? Pet dander. ? Animal hair.  This condition can make your eye red or pink. It can also make your eye feel itchy. This condition cannot be spread from one person to another person (is not contagious). Follow these instructions at home:  Try not to be around things that you are allergic to.  Take or apply over-the-counter and prescription medicines only as told by your doctor. These include any eye drops.  Place a cool, clean washcloth on your eye for 10-20 minutes. Do this  3-4 times a day.  Do not touch or rub your eyes.  Do not wear contact lenses until the inflammation is gone. Wear glasses instead.  Do not wear eye makeup until the inflammation is gone.  Keep all follow-up visits as told by your doctor. This is important. Contact a doctor if:  Your symptoms get worse.  Your symptoms do not get better with treatment.  You have mild eye pain.  You are sensitive to light,  You have spots or blisters on your eyes.  You have pus coming from your eye.  You have a fever. Get help right away if:  You have redness, swelling, or other symptoms in only one eye.  Your vision is blurry.  You have vision changes.  You have very bad eye pain. Summary  Allergic conjunctivitis is caused by allergies. It can make your eye red or pink, and it can make your eye feel itchy.  This condition cannot be spread from one person to another person (is not contagious).  Try not to be around things that you are allergic to.  Take or apply over-the-counter and prescription medicines only as told by your doctor. These include any eye  drops.  Contact your doctor if your symptoms get worse or they do not get better with treatment. This information is not intended to replace advice given to you by your health care provider. Make sure you discuss any questions you have with your health care provider. Document Released: 08/08/2009 Document Revised: 10/13/2015 Document Reviewed: 10/13/2015 Elsevier Interactive Patient Education  2017 [employer]

## 2017-10-10 NOTE — Progress Notes (Signed)
Patient ID: Eric Mcdonald, male    DOB: 02-17-1966, 52 y.o.   MRN: [id number]  PCP: Salley Scarleturham, Kawanta F, MD  Chief Complaint  Patient presents with  . right eye redness    symptoms for 1 day    Subjective:   Eric Mcdonald is a 52 y.o. male, presents to clinic with CC of one day of right eye redness, drainage, irritation and mild swelling.    Eye Pain   The right eye is affected. This is a new problem. The current episode started yesterday. The problem occurs constantly. The problem has been gradually worsening. There was no injury mechanism. The patient is experiencing no pain. There is known exposure to pink eye. He does not wear contacts. Associated symptoms include an eye discharge, eye redness, a foreign body sensation, itching and a recent URI. Pertinent negatives include no blurred vision, double vision, fever, nausea, photophobia or vomiting. He has tried eye drops for the symptoms. The treatment provided mild relief.      Patient Active Problem List   Diagnosis Date Noted  . Fever 11/24/2014  . Polyarthralgia 11/24/2014  . Pyrexia   . Marital conflict involving divorce 08/21/2014  . Depression with anxiety 08/19/2014  . Elevated hemoglobin (HCC) 03/02/2014  . Routine general medical examination at a health care facility 07/16/2013  . Low testosterone 07/16/2013  . Erectile dysfunction 07/16/2013     Prior to Admission medications   Medication Sig Start Date End Date Taking? Authorizing Provider  ranitidine (ZANTAC) 150 MG tablet Take 150 mg by mouth 2 (two) times daily.   Yes [provider]  ibuprofen (ADVIL,MOTRIN) 200 MG tablet Take 200-600 mg by mouth every 6 (six) hours as needed for headache or moderate pain.    [provider]  sildenafil (VIAGRA) 100 MG tablet Take 1 tablet (100 mg total) by mouth daily as needed for erectile dysfunction. Patient not taking: Reported on 10/10/2017 10/24/14   Salley Scarleturham, Kawanta F, MD  testosterone Cathren Laine(ANDRODERM)  4 MG/24HR PT24 patch Place 1 patch onto the skin daily. Patient not taking: Reported on 10/10/2017 03/13/15   Salley Scarleturham, Kawanta F, MD     Allergies  Allergen Reactions  . Lamisil [Terbinafine]   . Peanuts [Peanut Oil]     Bothers galbladder, but can peanuts not since the galbladder got removed     Family History  Problem Relation Age of Onset  . COPD Father        Emphysema  . Diabetes Maternal Grandmother   . Cancer Maternal Grandfather        lung  . Cancer Paternal Grandfather        lung cancer     Social History   Socioeconomic History  . Marital status: Married    Spouse name: Not on file  . Number of children: Not on file  . Years of education: Not on file  . Highest education level: Not on file  Occupational History  . Not on file  Social Needs  . Financial resource strain: Not on file  . Food insecurity:    Worry: Not on file    Inability: Not on file  . Transportation needs:    Medical: Not on file    Non-medical: Not on file  Tobacco Use  . Smoking status: Former Smoker    Packs/day: 0.50    Types: Cigarettes, Pipe, Cigars  . Smokeless tobacco: Never Used  Substance and Sexual Activity  . Alcohol use: No  Alcohol/week: 0.0 standard drinks  . Drug use: No  . Sexual activity: Not Currently  Lifestyle  . Physical activity:    Days per week: Not on file    Minutes per session: Not on file  . Stress: Not on file  Relationships  . Social connections:    Talks on phone: Not on file    Gets together: Not on file    Attends religious service: Not on file    Active member of club or organization: Not on file    Attends meetings of clubs or organizations: Not on file    Relationship status: Not on file  . Intimate partner violence:    Fear of current or ex partner: Not on file    Emotionally abused: Not on file    Physically abused: Not on file    Forced sexual activity: Not on file  Other Topics Concern  . Not on file  Social History Narrative  .  Not on file     Review of Systems  Constitutional: Negative.  Negative for fever.  HENT: Negative.   Eyes: Positive for pain, discharge, redness and itching. Negative for blurred vision, double vision and photophobia.  Respiratory: Negative.   Cardiovascular: Negative.   Gastrointestinal: Negative.  Negative for nausea and vomiting.  Endocrine: Negative.   Genitourinary: Negative.   Musculoskeletal: Negative.   Skin: Negative.   Allergic/Immunologic: Negative.   Neurological: Negative.   Hematological: Negative.   Psychiatric/Behavioral: Negative.   All other systems reviewed and are negative.      Objective:    Vitals:   10/10/17 1409  BP: 124/88  Pulse: 84  Resp: 16  Temp: 97.7 F (36.5 C)  TempSrc: Oral  SpO2: 97%  Weight: 203 lb 3.2 oz (92.2 kg)  Height: 6' (1.829 m)      Physical Exam  Constitutional: He appears well-developed and well-nourished. No distress.  HENT:  Head: Normocephalic and atraumatic.  Right Ear: External ear normal.  Left Ear: External ear normal.  Nose: Nose normal.  Nasal mucosa diffusely erythematous with scant clear discharge, no congestion, no sinus tenderness to palpation posterior oropharynx normal without erythema, edema, exudate  Eyes: Pupils are equal, round, and reactive to light. EOM are normal. Lids are everted and swept, no foreign bodies found. Right eye exhibits chemosis and discharge. Right eye exhibits no exudate and no hordeolum. No foreign body present in the right eye. Left eye exhibits no chemosis, no discharge and no hordeolum. Right conjunctiva is injected. Right conjunctiva has no hemorrhage. Left conjunctiva is not injected. Left conjunctiva has no hemorrhage. No scleral icterus. Right eye exhibits normal extraocular motion and no nystagmus. Left eye exhibits normal extraocular motion and no nystagmus.  Slit lamp exam:      The right eye shows no corneal abrasion and no fluorescein uptake.  Neck: Normal range of  motion. Neck supple. No tracheal deviation present.  Cardiovascular: Normal rate and regular rhythm.  Pulmonary/Chest: Effort normal and breath sounds normal. No stridor. No respiratory distress.  Musculoskeletal: Normal range of motion.  Neurological: He is alert. He exhibits normal muscle tone. Coordination normal.  Skin: Skin is warm and dry. No rash noted. He is not diaphoretic.  Psychiatric: He has a normal mood and affect. His behavior is normal.  Nursing note and vitals reviewed.    Visual Acuity Screening   Right eye Left eye Both eyes  Without correction: 20/20 20/30 20/15   With correction:  Assessment & Plan:      ICD-10-CM   1. Conjunctivitis of right eye, unspecified conjunctivitis type H10.9 cromolyn (OPTICROM) 4 % ophthalmic solution  2. Rhinitis, unspecified type J31.0     Patient instructed to continue his over-the-counter eyedrops, begin taking his home nasal allergy medications, first try cromolyn drops in affected eye, cool compresses, good hand hygiene.  He was concerned with some white discharge first thing in the morning however throughout the day and in exam room he has clear tearing and clear discharge and no purulence.  He is to hold the Polytrim drops and only use if he develops persistent purulent drainage throughout the day, if he has this and begins medication he was instructed to seek immediate follow-up with any severe pain or decreased vision, or with any periorbital edema or erythema.  Pictures and educational materials were printed out for him and discussed at length and he verbalized understanding.  His visual acuity was normal, floor seen eye exam was reassuring there are no abrasions, foreign bodies identified or uptake.  Associated nasal erythema is also reassuring and more suggestive as a likely viral source.  He does have sick contacts with his son who recently had eye issues, and he himself has had some mild URI symptoms for the past  week.   Danelle Berry, PA-C 10/10/17 2:38 PM

## 2017-10-15 ENCOUNTER — Telehealth: Payer: Self-pay | Admitting: Family Medicine

## 2017-10-15 NOTE — Telephone Encounter (Signed)
Patient called in and stated that he is still experiencing Pink eye. He states it is now in both eyes. I scheduled an appointment for 10/21/17 per patient's request. He would like to know if in the meantime we could call in refills on eye drops since he is now having to use in both eyes. Please advise?

## 2017-10-16 NOTE — Telephone Encounter (Signed)
Left message return call

## 2017-10-16 NOTE — Telephone Encounter (Signed)
I had explained to him that IF he got it in BOTH eyes that it was a virus and the antibiotic drops would not help this.  There would be nothing else for me to do for him for simple pink eye.  If he has swelling or redness around his eyes or change in vision we could check on that, but otherwise he would not need to follow up.  It usually just goes away on its own when the virus has run its course.

## 2017-10-21 ENCOUNTER — Ambulatory Visit: Payer: Self-pay | Admitting: Family Medicine

## 2017-10-23 ENCOUNTER — Other Ambulatory Visit: Payer: Self-pay

## 2017-10-23 ENCOUNTER — Encounter: Payer: Self-pay | Admitting: Family Medicine

## 2017-10-23 ENCOUNTER — Ambulatory Visit: Payer: BC Managed Care – PPO | Admitting: Family Medicine

## 2017-10-23 VITALS — BP 124/72 | HR 72 | Temp 97.9°F | Resp 14 | Ht 72.0 in | Wt 199.0 lb

## 2017-10-23 DIAGNOSIS — H109 Unspecified conjunctivitis: Secondary | ICD-10-CM

## 2017-10-23 DIAGNOSIS — J310 Chronic rhinitis: Secondary | ICD-10-CM | POA: Diagnosis not present

## 2017-10-23 MED ORDER — MOMETASONE FUROATE 50 MCG/ACT NA SUSP: 2.0000 | Freq: Every day | NASAL | 0 refills | Status: AC

## 2017-10-23 MED ORDER — KETOROLAC TROMETHAMINE 0.4 % OP SOLN: 1.0000 [drp] | Freq: Four times a day (QID) | OPHTHALMIC | 0 refills | Status: AC | PRN

## 2017-10-23 NOTE — Patient Instructions (Signed)
Allergic Conjunctivitis A clear membrane (conjunctiva) covers the white part of your eye and the inner surface of your eyelid. Allergic conjunctivitis happens when this membrane has inflammation. This is caused by allergies. Common causes of allergic reactions (allergens)include:  Outdoor allergens, such as: ? Pollen. ? Grass and weeds. ? Mold spores.  Indoor allergens, such as: ? Dust. ? Smoke. ? Mold. ? Pet dander. ? Animal hair.  This condition can make your eye red or pink. It can also make your eye feel itchy. This condition cannot be spread from one person to another person (is not contagious). Follow these instructions at home:  Try not to be around things that you are allergic to.  Take or apply over-the-counter and prescription medicines only as told by your doctor. These include any eye drops.  Place a cool, clean washcloth on your eye for 10-20 minutes. Do this 3-4 times a day.  Do not touch or rub your eyes.  Do not wear contact lenses until the inflammation is gone. Wear glasses instead.  Do not wear eye makeup until the inflammation is gone.  Keep all follow-up visits as told by your doctor. This is important. Contact a doctor if:  Your symptoms get worse.  Your symptoms do not get better with treatment.  You have mild eye pain.  You are sensitive to light,  You have spots or blisters on your eyes.  You have pus coming from your eye.  You have a fever. Get help right away if:  You have redness, swelling, or other symptoms in only one eye.  Your vision is blurry.  You have vision changes.  You have very bad eye pain. Summary  Allergic conjunctivitis is caused by allergies. It can make your eye red or pink, and it can make your eye feel itchy.  This condition cannot be spread from one person to another person (is not contagious).  Try not to be around things that you are allergic to.  Take or apply over-the-counter and prescription medicines  only as told by your doctor. These include any eye drops.  Contact your doctor if your symptoms get worse or they do not get better with treatment. This information is not intended to replace advice given to you by your health care provider. Make sure you discuss any questions you have with your health care provider. Document Released: 08/08/2009 Document Revised: 10/13/2015 Document Reviewed: 10/13/2015 Elsevier Interactive Patient Education  2017 [employer]  Allergic Rhinitis, Adult Allergic rhinitis is an allergic reaction that affects the mucous membrane inside the nose. It causes sneezing, a runny or stuffy nose, and the feeling of mucus going down the back of the throat (postnasal drip). Allergic rhinitis can be mild to severe. There are two types of allergic rhinitis:  Seasonal. This type is also called hay fever. It happens only during certain seasons.  Perennial. This type can happen at any time of the year.  What are the causes? This condition happens when the body's defense system (immune system) responds to certain harmless substances called allergens as though they were germs.  Seasonal allergic rhinitis is triggered by pollen, which can come from grasses, trees, and weeds. Perennial allergic rhinitis may be caused by:  House dust mites.  Pet dander.  Mold spores.  What are the signs or symptoms? Symptoms of this condition include:  Sneezing.  Runny or stuffy nose (nasal congestion).  Postnasal drip.  Itchy nose.  Tearing of the eyes.  Trouble sleeping.  Daytime  sleepiness.  How is this diagnosed? This condition may be diagnosed based on:  Your medical history.  A physical exam.  Tests to check for related conditions, such as: ? Asthma. ? Pink eye. ? Ear infection. ? Upper respiratory infection.  Tests to find out which allergens trigger your symptoms. These may include skin or blood tests.  How is this treated? There is no cure for this  condition, but treatment can help control symptoms. Treatment may include:  Taking medicines that block allergy symptoms, such as antihistamines. Medicine may be given as a shot, nasal spray, or pill.  Avoiding the allergen.  Desensitization. This treatment involves getting ongoing shots until your body becomes less sensitive to the allergen. This treatment may be done if other treatments do not help.  If taking medicine and avoiding the allergen does not work, new, stronger medicines may be prescribed.  Follow these instructions at home:  Find out what you are allergic to. Common allergens include smoke, dust, and pollen.  Avoid the things you are allergic to. These are some things you can do to help avoid allergens: ? Replace carpet with wood, tile, or vinyl flooring. Carpet can trap dander and dust. ? Do not smoke. Do not allow smoking in your home. ? Change your heating and air conditioning filter at least once a month. ? During allergy season:  Keep windows closed as much as possible.  Plan outdoor activities when pollen counts are lowest. This is usually during the evening hours.  When coming indoors, change clothing and shower before sitting on furniture or bedding.  Take over-the-counter and prescription medicines only as told by your health care provider.  Keep all follow-up visits as told by your health care provider. This is important. Contact a health care provider if:  You have a fever.  You develop a persistent cough.  You make whistling sounds when you breathe (you wheeze).  Your symptoms interfere with your normal daily activities. Get help right away if:  You have shortness of breath. Summary  This condition can be managed by taking medicines as directed and avoiding allergens.  Contact your health care provider if you develop a persistent cough or fever.  During allergy season, keep windows closed as much as possible. This information is not intended  to replace advice given to you by your health care provider. Make sure you discuss any questions you have with your health care provider. Document Released: 11/13/2000 Document Revised: 03/28/2016 Document Reviewed: 03/28/2016 Elsevier Interactive Patient Education  [employer].

## 2017-10-23 NOTE — Progress Notes (Signed)
Patient ID: Eric Mcdonald, male    DOB: 04/16/1965, 52 y.o.   MRN: [id number]  PCP: Salley Scarlet, MD  Chief Complaint  Patient presents with  . Follow-up    R eye irritation/ weakness    Subjective:   10/23/17 Eric Mcdonald is a 52 y.o. male returns to clinic with continued right eye irritation and now bilateral eye redness and irritation.  He has been using cromolyn eyedrops bilaterally and they provide temporary relief.  He has had improvement with his right eye with decreased swelling, decreased redness and no more purulent discharge or constant clear discharge.  He does now have bilateral eye irritation, very mild redness and very mild swelling of eyelids.  He states that the pharmacy did not give him a second antibiotic drop, this was reviewed with him, he had Polytrim antibiotic eyedrops which she was only to use, fill at the pharmacy and start using if he had purulent drainage continuously from his right eye.  Prescription was printed for him and stapled to his discharge paperwork which was highlighted and reviewed with him at his last visit.  Patient had even called last week complaining of bilateral eye pain and wanted a another appointment and he was reminded that if the redness or irritation spread to his other eye that it was likely a viral illness and we just have to run its course.  He did not come last week and had scheduled this week, missed an appointment earlier this week and then came today for follow-up.   He also reports going for a work physical yesterday being diagnosed with sinus infection and being given some medication which he is taking 2 times a day.  He does not know what medications are.  He was advised to use antihistamines and steroid nasal spray last time I evaluated him, he states that he use the nasal spray one time only.  Complains of nasal congestion, sensation of ears being blocked.   He does not have eye pain, blurry vision, fevers, headache, nausea  or vomiting.   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  I have reviewed the chart, media tab, immunization tab and the immunization databank was checked by nursing staff and we do not have any records available for him.  He was encouraged to call his other places he is obtain medical care, physicals and also to contact HR departments of past employers/school systems to get record from them because we do not have any for him.   HPI from 10/10/17: Eric Mcdonald is a 52 y.o. male, presents to clinic with CC of one day of right eye redness, drainage, irritation and mild swelling.    Eye Pain   The right eye is affected. This is a new problem. The current episode started yesterday. The problem occurs constantly. The problem has been gradually worsening. There was no injury mechanism. The patient is experiencing no pain. There is known exposure to pink eye. He does not wear contacts. Associated symptoms include eye redness, a foreign body sensation, itching and a recent URI. Pertinent negatives include no blurred vision, eye discharge, double vision, fever, nausea, photophobia or vomiting. He has tried eye drops for the symptoms. The treatment provided mild relief.      Patient Active Problem List   Diagnosis Date Noted  . Fever 11/24/2014  .  Polyarthralgia 11/24/2014  . Pyrexia   . Marital conflict involving divorce 08/21/2014  . Depression with anxiety 08/19/2014  . Elevated hemoglobin (HCC) 03/02/2014  . Routine general medical examination at a health care facility 07/16/2013  . Low testosterone 07/16/2013  . Erectile dysfunction 07/16/2013     Prior to Admission medications   Medication Sig Start Date End Date Taking? Authorizing Provider  ranitidine (ZANTAC) 150 MG tablet Take 150 mg by mouth 2 (two) times daily.   Yes [provider]    ibuprofen (ADVIL,MOTRIN) 200 MG tablet Take 200-600 mg by mouth every 6 (six) hours as needed for headache or moderate pain.    [provider]  sildenafil (VIAGRA) 100 MG tablet Take 1 tablet (100 mg total) by mouth daily as needed for erectile dysfunction. Patient not taking: Reported on 10/10/2017 10/24/14   Salley Scarleturham, Kawanta F, MD  testosterone Cathren Laine(ANDRODERM) 4 MG/24HR PT24 patch Place 1 patch onto the skin daily. Patient not taking: Reported on 10/10/2017 03/13/15   Salley Scarleturham, Kawanta F, MD     Allergies  Allergen Reactions  . Lamisil [Terbinafine]   . Peanuts [Peanut Oil]     Bothers galbladder, but can peanuts not since the galbladder got removed     Family History  Problem Relation Age of Onset  . COPD Father        Emphysema  . Diabetes Maternal Grandmother   . Cancer Maternal Grandfather        lung  . Cancer Paternal Grandfather        lung cancer     Social History   Socioeconomic History  . Marital status: Married    Spouse name: Not on file  . Number of children: Not on file  . Years of education: Not on file  . Highest education level: Not on file  Occupational History  . Not on file  Social Needs  . Financial resource strain: Not on file  . Food insecurity:    Worry: Not on file    Inability: Not on file  . Transportation needs:    Medical: Not on file    Non-medical: Not on file  Tobacco Use  . Smoking status: Former Smoker    Packs/day: 0.50    Types: Cigarettes, Pipe, Cigars  . Smokeless tobacco: Never Used  Substance and Sexual Activity  . Alcohol use: No    Alcohol/week: 0.0 standard drinks  . Drug use: No  . Sexual activity: Not Currently  Lifestyle  . Physical activity:    Days per week: Not on file    Minutes per session: Not on file  . Stress: Not on file  Relationships  . Social connections:    Talks on phone: Not on file    Gets together: Not on file    Attends religious service: Not on file    Active member of club or  organization: Not on file    Attends meetings of clubs or organizations: Not on file    Relationship status: Not on file  . Intimate partner violence:    Fear of current or ex partner: Not on file    Emotionally abused: Not on file    Physically abused: Not on file    Forced sexual activity: Not on file  Other Topics Concern  . Not on file  Social History Narrative  . Not on file     Review of Systems  Constitutional: Negative.  Negative for fever.  HENT: Negative.   Eyes: Positive  for redness and itching. Negative for blurred vision, double vision, photophobia, pain, discharge and visual disturbance.  Respiratory: Negative.   Cardiovascular: Negative.   Gastrointestinal: Negative.  Negative for nausea and vomiting.  Endocrine: Negative.   Genitourinary: Negative.   Musculoskeletal: Negative.   Skin: Negative.   Allergic/Immunologic: Negative.   Neurological: Negative.   Hematological: Negative.   Psychiatric/Behavioral: Negative.   All other systems reviewed and are negative.      Objective:    Vitals:   10/23/17 0941  BP: 124/72  Pulse: 72  Resp: 14  Temp: 97.9 F (36.6 C)  TempSrc: Oral  SpO2: 97%  Weight: 199 lb (90.3 kg)  Height: 6' (1.829 m)      Physical Exam  Constitutional: He appears well-developed and well-nourished. No distress.  HENT:  Head: Normocephalic and atraumatic.  Right Ear: External ear normal.  Left Ear: External ear normal.  Nose: Mucosal edema and rhinorrhea present.  Mouth/Throat: No oropharyngeal exudate.  Nasal mucosa diffusely erythematous with scant clear discharge, + congestion Mild OP erythema w/o edema or exudate No lymphadenopathy  Eyes: Pupils are equal, round, and reactive to light. EOM are normal. Lids are everted and swept, no foreign bodies found. Right eye exhibits no chemosis, no discharge, no exudate and no hordeolum. No foreign body present in the right eye. Left eye exhibits no chemosis, no discharge, no exudate  and no hordeolum. No foreign body present in the left eye. Right conjunctiva is injected. Right conjunctiva has no hemorrhage. Left conjunctiva is injected. Left conjunctiva has no hemorrhage. No scleral icterus. Right eye exhibits normal extraocular motion and no nystagmus. Left eye exhibits normal extraocular motion and no nystagmus.  Slit lamp exam:      The right eye shows no corneal abrasion and no fluorescein uptake.  Very mild bilateral upper and lower eyelid erythema and mild edema  Neck: Normal range of motion. Neck supple. No tracheal deviation present.  Cardiovascular: Normal rate and regular rhythm.  Pulmonary/Chest: Effort normal and breath sounds normal. No stridor. No respiratory distress.  Musculoskeletal: Normal range of motion.  Neurological: He is alert. He exhibits normal muscle tone. Coordination normal.  Skin: Skin is warm and dry. No rash noted. He is not diaphoretic.  Psychiatric: He has a normal mood and affect. His behavior is normal.  Nursing note and vitals reviewed.   Vision Screening 10/23/17 Edited by: Phillips Odor, LPN   Right eye Left eye Both eyes  Without correction 20/20 20/15 20/15     Vision Screening on 10/10/2017  Edited by: Phineas Semen A, CMA   Right eye Left eye Both eyes  Without correction 20/20 20/30 20/15           Assessment & Plan:      ICD-10-CM   1. Conjunctivitis of right eye, unspecified conjunctivitis type H10.9 Ambulatory referral to Ophthalmology  2. Rhinitis, unspecified type J31.0      Pt with continued conjunctivitis and rhinitis, on 10/10/17 was in right eye, now bilateral with some improvement, he has been non-compliant with past tx plan.  He also has been seen and is being treated for sinus sx, but he does not know the medication.  Reviewed with the patient again treatment for nasal and eye symptoms.  His visual acuity was repeated today and is reassuring.  Rx for toradol eye drops to help with  irritation/inflammation, can continue cromolyn drops.  Again instructed to use OTC antihistamine daily + steroid nasal spray daily for 1-2 weeks.  He  will call to let us know his other medications prescribed elsewhere.  Ophthalmology referral for follow up eval.  Pt also requested help getting records of immunizations etc. he does not know past clinics where he has had evaluations, immunizations, PPDs or preemployment physicals so I cannot request records for him.  There are no records in the state database or in this electronic medical record that I can find.  He was encouraged to follow-up with past employer's HR department to obtain prior records from them.  Danelle Berry, PA-C 10/23/17 9:47 AM

## 2017-10-27 ENCOUNTER — Telehealth: Payer: Self-pay | Admitting: Family Medicine

## 2017-10-27 NOTE — Telephone Encounter (Signed)
Left message return call. (also need to know for referral where patient would like to go having difficulty finding ophthal[place] is moving)

## 2017-10-27 NOTE — Telephone Encounter (Signed)
Eric Mcdonald is calling regarding his vision, he thought it was getting better, but now he thinks it is getting worse  Please call him at [phone]

## 2017-10-28 ENCOUNTER — Encounter: Payer: Self-pay | Admitting: Family Medicine

## 2017-10-28 NOTE — Telephone Encounter (Signed)
Spoke with patient and patient informed me that [place] would [place]. [facility].

## 2018-02-13 ENCOUNTER — Other Ambulatory Visit: Payer: Self-pay | Admitting: *Deleted

## 2018-02-13 ENCOUNTER — Encounter: Payer: Self-pay | Admitting: *Deleted

## 2018-02-13 MED ORDER — SILDENAFIL CITRATE 100 MG PO TABS: 100.0000 mg | ORAL_TABLET | Freq: Every day | ORAL | 0 refills | Status: AC | PRN

## 2018-02-13 NOTE — Telephone Encounter (Signed)
Letter sent.

## 2018-02-13 NOTE — Telephone Encounter (Signed)
Will give 1 refill, he needs a physical

## 2018-02-13 NOTE — Telephone Encounter (Signed)
Received call from patient.   Requested refill on sildenafil.   Ok to refill??
